# Patient Record
Sex: Male | Born: 1985
Health system: Southern US, Community
[De-identification: ages and names within clinical notes are randomized; demographics above are authoritative.]

## PROBLEM LIST (undated history)

## (undated) ENCOUNTER — Emergency Department (HOSPITAL_COMMUNITY): Admission: EM | Payer: 59 | Source: Home / Self Care

## (undated) DIAGNOSIS — R001 Bradycardia, unspecified: Secondary | ICD-10-CM

## (undated) DIAGNOSIS — Z8489 Family history of other specified conditions: Secondary | ICD-10-CM

## (undated) HISTORY — PX: OTHER SURGICAL HISTORY: SHX169

## (undated) HISTORY — PX: ANKLE SURGERY: SHX546

---
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
## 2011-05-11 ENCOUNTER — Ambulatory Visit: Payer: Self-pay

## 2012-03-02 ENCOUNTER — Ambulatory Visit
Admission: RE | Admit: 2012-03-02 | Discharge: 2012-03-02 | Disposition: A | Discharge status: Home / Self Care | Payer: 59 | Source: Ambulatory Visit | Attending: Family Medicine | Admitting: Family Medicine

## 2012-03-02 ENCOUNTER — Other Ambulatory Visit: Payer: Self-pay | Admitting: Family Medicine

## 2012-03-02 DIAGNOSIS — R079 Chest pain, unspecified: Secondary | ICD-10-CM

## 2014-11-15 ENCOUNTER — Ambulatory Visit (INDEPENDENT_AMBULATORY_CARE_PROVIDER_SITE_OTHER): Payer: 59 | Admitting: Emergency Medicine

## 2014-11-15 VITALS — BP 122/86 | HR 55 | Temp 98.0°F | Resp 16 | Ht 74.0 in | Wt 156.0 lb

## 2014-11-15 DIAGNOSIS — J209 Acute bronchitis, unspecified: Secondary | ICD-10-CM | POA: Diagnosis not present

## 2014-11-15 DIAGNOSIS — J0140 Acute pansinusitis, unspecified: Secondary | ICD-10-CM | POA: Diagnosis not present

## 2014-11-15 MED ORDER — PSEUDOEPHEDRINE-GUAIFENESIN ER 60-600 MG PO TB12: 1.0000 | ORAL_TABLET | Freq: Two times a day (BID) | ORAL | Status: AC

## 2014-11-15 MED ORDER — AMOXICILLIN-POT CLAVULANATE 875-125 MG PO TABS: 1.0000 | ORAL_TABLET | Freq: Two times a day (BID) | ORAL | Status: DC

## 2014-11-15 MED ORDER — HYDROCOD POLST-CPM POLST ER 10-8 MG/5ML PO SUER: 5.0000 mL | Freq: Two times a day (BID) | ORAL | Status: DC

## 2014-11-15 NOTE — Progress Notes (Signed)
Subjective:  Patient ID: Robert Charles, male    DOB: [birth year]  Age: 29 y.o. MRN: [id number]  CC: Cough; Fever; and Sore Throat   HPI Robert Charles presents   With a fever to 101. He has nasal congestion postnasal drainage. That's up purulent collar. He has a cough productive of purulent sputum. He has no wheezing or shortness breath. No nausea vomiting. No rash. No stool change. He's had no improvement with over-the-counter medication. 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   He has past surgical history that includes Ankle surgery (Right, reconstruction).   His  family history includes Heart disease in his father.  He   reports that he has never smoked. He does not have any smokeless tobacco history on file. He reports that he drinks about 6.0 oz of alcohol per week. He reports that he does not use illicit drugs.  No outpatient prescriptions prior to visit.   No facility-administered medications prior to visit.    Social History   Social History  . Marital Status: Single    Spouse Name: N/A  . Number of Children: N/A  . Years of Education: N/A   Social History Main Topics  . Smoking status: Never Smoker   . Smokeless tobacco: None  . Alcohol Use: 6.0 oz/week    10 Standard drinks or equivalent per week  . Drug Use: No  . Sexual Activity: Not Asked   Other Topics Concern  . None   Social History Narrative  . None     Review of Systems  Constitutional: Negative for fever, chills and appetite change.  HENT: Negative for congestion, ear pain, postnasal drip, sinus pressure and sore throat.   Eyes: Negative for pain and redness.  Respiratory: Negative for cough, shortness of breath and wheezing.   Cardiovascular: Negative for leg swelling.  Gastrointestinal: Negative for nausea, vomiting, abdominal pain, diarrhea, constipation and blood in stool.  Endocrine: Negative for polyuria.  Genitourinary:  Negative for dysuria, urgency, frequency and flank pain.  Musculoskeletal: Negative for gait problem.  Skin: Negative for rash.  Neurological: Negative for weakness and headaches.  Psychiatric/Behavioral: Negative for confusion and decreased concentration. The patient is not nervous/anxious.     Objective:  BP 122/86 mmHg  Pulse 55  Temp(Src) 98 F (36.7 C) (Oral)  Resp 16  Ht  (1.88 m)  Wt 156 lb (70.761 kg)  BMI 20.02 kg/m2  SpO2 99%  Physical Exam  Constitutional: He is oriented to person, place, and time. He appears well-developed and well-nourished. No distress.  HENT:  Head: Normocephalic and atraumatic.  Right Ear: External ear normal.  Left Ear: External ear normal.  Nose: Nose normal.  Eyes: Conjunctivae and EOM are normal. Pupils are equal, round, and reactive to light. No scleral icterus.  Neck: Normal range of motion. Neck supple. No tracheal deviation present.  Cardiovascular: Normal rate, regular rhythm and normal heart sounds.   Pulmonary/Chest: Effort normal. No respiratory distress. He has no wheezes. He has no rales.  Abdominal: He exhibits no mass. There is no tenderness. There is no rebound and no guarding.  Musculoskeletal: He exhibits no edema.  Lymphadenopathy:    He has no cervical adenopathy.  Neurological: He is alert and oriented to person, place, and time. Coordination normal.  Skin: Skin is warm and dry. No rash noted.  Psychiatric: He has a normal mood and affect. His behavior is normal.  Assessment & Plan:   Damarien was seen today for cough, fever and sore throat.  Diagnoses and all orders for this visit:  Acute pansinusitis, recurrence not specified  Acute bronchitis, unspecified organism  Other orders -     amoxicillin-clavulanate (AUGMENTIN) 875-125 MG per tablet; Take 1 tablet by mouth 2 (two) times daily. -     pseudoephedrine-guaifenesin (MUCINEX D) 60-600 MG per tablet; Take 1 tablet by mouth every 12 (twelve) hours. -      chlorpheniramine-HYDROcodone (TUSSIONEX PENNKINETIC ER) 10-8 MG/5ML SUER; Take 5 mLs by mouth 2 (two) times daily.  I am having Robert Charles start on amoxicillin-clavulanate, pseudoephedrine-guaifenesin, and chlorpheniramine-HYDROcodone.  Meds ordered this encounter  Medications  . amoxicillin-clavulanate (AUGMENTIN) 875-125 MG per tablet    Sig: Take 1 tablet by mouth 2 (two) times daily.    Dispense:  20 tablet    Refill:  0  . pseudoephedrine-guaifenesin (MUCINEX D) 60-600 MG per tablet    Sig: Take 1 tablet by mouth every 12 (twelve) hours.    Dispense:  18 tablet    Refill:  0  . chlorpheniramine-HYDROcodone (TUSSIONEX PENNKINETIC ER) 10-8 MG/5ML SUER    Sig: Take 5 mLs by mouth 2 (two) times daily.    Dispense:  60 mL    Refill:  0    Appropriate red flag conditions were discussed with the patient as well as actions that should be taken.  Patient expressed his understanding.  Follow-up: Return if symptoms worsen or fail to improve.  Carmelina Dane, MD

## 2014-11-15 NOTE — Patient Instructions (Signed)

## 2016-01-10 ENCOUNTER — Telehealth: Payer: Self-pay | Admitting: Interventional Cardiology

## 2016-01-10 NOTE — Telephone Encounter (Signed)
    Please request the record of his echo done at [facility], likely in early 2014.  Thanks.  If the patient needs to be reached, call [phone].  JV

## 2016-01-11 NOTE — Telephone Encounter (Addendum)
**Note De-Identified Robert Charles Obfuscation** I have requested the pts Echo results from 2014 from [facility].

## 2016-09-20 ENCOUNTER — Emergency Department (HOSPITAL_BASED_OUTPATIENT_CLINIC_OR_DEPARTMENT_OTHER)
Admission: EM | Admit: 2016-09-20 | Discharge: 2016-09-21 | Disposition: A | Discharge status: Home / Self Care | Payer: 59 | Attending: Physician Assistant | Admitting: Physician Assistant

## 2016-09-20 ENCOUNTER — Encounter (HOSPITAL_BASED_OUTPATIENT_CLINIC_OR_DEPARTMENT_OTHER): Payer: Self-pay | Admitting: Respiratory Therapy

## 2016-09-20 DIAGNOSIS — Y929 Unspecified place or not applicable: Secondary | ICD-10-CM | POA: Insufficient documentation

## 2016-09-20 DIAGNOSIS — S0185XA Open bite of other part of head, initial encounter: Secondary | ICD-10-CM

## 2016-09-20 DIAGNOSIS — S0993XA Unspecified injury of face, initial encounter: Secondary | ICD-10-CM | POA: Diagnosis present

## 2016-09-20 DIAGNOSIS — Y999 Unspecified external cause status: Secondary | ICD-10-CM | POA: Diagnosis not present

## 2016-09-20 DIAGNOSIS — S01511A Laceration without foreign body of lip, initial encounter: Secondary | ICD-10-CM | POA: Diagnosis not present

## 2016-09-20 DIAGNOSIS — W540XXA Bitten by dog, initial encounter: Secondary | ICD-10-CM | POA: Insufficient documentation

## 2016-09-20 DIAGNOSIS — Y939 Activity, unspecified: Secondary | ICD-10-CM | POA: Diagnosis not present

## 2016-09-20 MED ORDER — LIDOCAINE HCL (PF) 1 % IJ SOLN
30.0000 mL | Freq: Once | INTRAMUSCULAR | Status: AC
  Administered 2016-09-21: 30 mL via INTRADERMAL
  Filled 2016-09-20: qty 30

## 2016-09-20 NOTE — ED Notes (Signed)
ED Provider at bedside. 

## 2016-09-20 NOTE — ED Triage Notes (Signed)
PT presents with c/o dog bite to right side of face.

## 2016-09-20 NOTE — ED Provider Notes (Signed)
[facility]   CSN: [id number] Arrival date & time: 09/20/16  2217     History   Chief Complaint Chief Complaint  Patient presents with  . Animal Bite    HPI Robert Charles is a 31 y.o. male.  HPI   31 year old male presenting with dog bite face. Patient's golden retriever mixed was playing with him and bit him in the lip. Dog is up-to-date on vaccines. Patient is up-to-date on tetanus.  History reviewed. No pertinent past medical history.  There are no active problems to display for this patient.   Past Surgical History:  Procedure Laterality Date  . ANKLE SURGERY Right reconstruction   2005       Home Medications    Prior to Admission medications   Medication Sig Start Date End Date Taking? Authorizing Provider  amoxicillin-clavulanate (AUGMENTIN) 875-125 MG per tablet Take 1 tablet by mouth 2 (two) times daily. 11/15/14   Carmelina DaneAnderson, Jeffery S, MD  chlorpheniramine-HYDROcodone [facility](TUSSIONEX PENNKINETIC ER) 10-8 MG/5ML SUER Take 5 mLs by mouth 2 (two) times daily. 11/15/14   Carmelina DaneAnderson, Jeffery S, MD    Family History Family History  Problem Relation Age of Onset  . Heart disease Father     Social History Social History  Substance Use Topics  . Smoking status: Never Smoker  . Smokeless tobacco: Not on file  . Alcohol use 6.0 oz/week    10 Standard drinks or equivalent per week     Allergies   Patient has no known allergies.   Review of Systems Review of Systems  Constitutional: Negative for activity change.  Respiratory: Negative for shortness of breath.   Cardiovascular: Negative for chest pain.  Gastrointestinal: Negative for abdominal pain.     Physical Exam Updated Vital Signs BP (!) 139/93 (BP Location: Left Arm)   Pulse 78   Temp 98.1 F (36.7 C) (Oral)   Resp 18   SpO2 99%   Physical Exam  Constitutional: He is oriented to person, place, and time. He appears well-nourished.  HENT:  Head: Normocephalic.  3  cm laceration crossing the vermilion border on the right-hand side. Patient has mustache.  As also left shoulder laceration on the interior of the mouth but is not through and through.  Eyes: Conjunctivae are normal.  Cardiovascular: Normal rate and regular rhythm.   Pulmonary/Chest: Effort normal and breath sounds normal. No respiratory distress.  Neurological: He is oriented to person, place, and time.  Skin: Skin is warm and dry. He is not diaphoretic.  Psychiatric: He has a normal mood and affect. His behavior is normal.     ED Treatments / Results  Labs (all labs ordered are listed, but only abnormal results are displayed) Labs Reviewed - No data to display  EKG  EKG Interpretation None       Radiology No results found.  Procedures Procedures (including critical care time)  Medications Ordered in ED Medications  lidocaine (PF) (XYLOCAINE) 1 % injection 30 mL (not administered)     Initial Impression / Assessment and Plan / ED Course  I have reviewed the triage vital signs and the nursing notes.  Pertinent labs & imaging results that were available during my care of the patient were reviewed by me and considered in my medical decision making (see chart for details).     Patient is here with dog bite to upper lip. Will repair given that it just happened, it is to the face and cosmetically needs repair. Augment  started.  LACERATION REPAIR Performed by: Arlana Hoveourteney L Amarianna Abplanalp Authorized by: Arlana Hoveourteney L Tamas Suen Consent: Verbal consent obtained. Risks and benefits: risks, benefits and alternatives were discussed Consent given by: patient Patient identity confirmed: provided demographic data Prepped and Draped in normal sterile fashion Wound explored  Laceration Location: upper lip Laceration Length: 3 cm  No Foreign Bodies seen or palpated  Anesthesia: local infiltration  Local anesthetic: lidocaine 1 w epinephrine  Anesthetic total: 7 ml  Irrigation  method: syringe Amount of cleaning: standard  Skin closure: 4-0 vicryl deep, and subcutaneously - 6 suttures 5-0 chromic gut for superficial - X4   Patient tolerance: Patient tolerated the procedure well with no immediate complications.   Final Clinical Impressions(s) / ED Diagnoses   Final diagnoses:  None    New Prescriptions New Prescriptions   No medications on file     Abelino DerrickMackuen, Giordan Fordham Lyn, MD 09/21/16 [phone]

## 2016-09-20 NOTE — ED Notes (Signed)
Right upper lip laceration measuring 2.0 cm

## 2016-09-21 DIAGNOSIS — S01511A Laceration without foreign body of lip, initial encounter: Secondary | ICD-10-CM | POA: Diagnosis not present

## 2016-09-21 MED ORDER — AMOXICILLIN-POT CLAVULANATE 875-125 MG PO TABS: 1.0000 | ORAL_TABLET | Freq: Two times a day (BID) | ORAL | 0 refills | Status: DC

## 2016-09-21 MED ORDER — IBUPROFEN 800 MG PO TABS
800.0000 mg | ORAL_TABLET | Freq: Once | ORAL | Status: AC
  Administered 2016-09-21: 800 mg via ORAL
  Filled 2016-09-21: qty 1

## 2016-09-21 MED ORDER — AMOXICILLIN-POT CLAVULANATE 875-125 MG PO TABS
1.0000 | ORAL_TABLET | Freq: Once | ORAL | Status: AC
  Administered 2016-09-21: 1 via ORAL
  Filled 2016-09-21: qty 1

## 2016-09-21 NOTE — ED Notes (Signed)
Sutures intact. No bleeding noted at d/c home. Voiced understanding of suture care, f/u, and antibiotics

## 2016-09-21 NOTE — Discharge Instructions (Signed)
Please take antibiotics as prescribed. As we discussed use Vaseline or Neosporin over the wound. The stitches should be in place for 5 days.  If signs of infection please return immediately to the emergency department or your primary care.

## 2017-06-08 DIAGNOSIS — K645 Perianal venous thrombosis: Secondary | ICD-10-CM | POA: Diagnosis not present

## 2017-06-30 DIAGNOSIS — Z0000 Encounter for general adult medical examination without abnormal findings: Secondary | ICD-10-CM | POA: Diagnosis not present

## 2018-05-05 ENCOUNTER — Ambulatory Visit
Admission: EM | Admit: 2018-05-05 | Discharge: 2018-05-05 | Disposition: A | Discharge status: Home / Self Care | Payer: 59

## 2018-05-05 ENCOUNTER — Other Ambulatory Visit: Payer: Self-pay

## 2018-05-05 DIAGNOSIS — S0990XA Unspecified injury of head, initial encounter: Secondary | ICD-10-CM | POA: Diagnosis not present

## 2018-05-05 DIAGNOSIS — H538 Other visual disturbances: Secondary | ICD-10-CM | POA: Diagnosis not present

## 2018-05-05 NOTE — ED Provider Notes (Signed)
[facility]    CSN: [id number] Arrival date & time: 05/05/18  1821     History   Chief Complaint Chief Complaint  Patient presents with  . Blurred Vision    HPI Robert Charles is a 33 y.o. male.   Robert Charles presents with complaints of episode today at work in which he developed blurring to his peripheral vision. He works in the [employer] at [employer]. 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 It didn't matter if he covered one of his eyes or the other, vision was the same. States he that was walking away from his work place and he felt some nausea and then mild dizziness like vertigo. He did not have head pain at the time of these symptoms. He was directed to the ER where he sat waiting, and symptoms resolved. Now his vision is normal, no blurring. No further dizziness or nausea. Now with mild headache, approximately 2/10. States overall his symptoms lasted approximately 45 minutes. He feels well now. He was concerned because two weeks ago he struck his head on a piece of equipment. He did not lose consciousness, he "saw stars."  Has not had a headache since this incident. States he had migraines as a child but hasn't had any migraines in probably 20 years. He was not evaluated after he struck his head. No previous head injury. He doesn't wear contacts or glasses. Doesn't take any prescribed medications. Patient states that he feels much better, doesn't want to go to the ER tonight, and his wife was quite insistent that he was seen.    ROS per HPI, negative if not otherwise mentioned.      History reviewed. No pertinent past medical history.  There are no active problems to display for this patient.   Past Surgical History:  Procedure Laterality Date  . ANKLE SURGERY Right reconstruction   2005       Home Medications    Prior to Admission medications   Medication Sig Start Date End Date Taking?  Authorizing Provider  amoxicillin-clavulanate (AUGMENTIN) 875-125 MG per tablet Take 1 tablet by mouth 2 (two) times daily. 11/15/14   Carmelina Dane, MD  amoxicillin-clavulanate (AUGMENTIN) 875-125 MG tablet Take 1 tablet by mouth every 12 (twelve) hours. 09/21/16   Mackuen, Courteney Lyn, MD  chlorpheniramine-HYDROcodone (TUSSIONEX PENNKINETIC ER) 10-8 MG/5ML SUER Take 5 mLs by mouth 2 (two) times daily. 11/15/14   Carmelina Dane, MD    Family History Family History  Problem Relation Age of Onset  . Heart disease Father     Social History Social History   Tobacco Use  . Smoking status: Never Smoker  . Smokeless tobacco: Never Used  Substance Use Topics  . Alcohol use: Yes    Alcohol/week: 10.0 standard drinks    Types: 10 Standard drinks or equivalent per week  . Drug use: No     Allergies   Patient has no known allergies.   Review of Systems Review of Systems   Physical Exam Triage Vital Signs ED Triage Vitals [05/05/18 1832]  Enc Vitals Group     BP (!) 144/91     Pulse Rate 70     Resp 18     Temp 97.9 F (36.6 C)     Temp Source Oral     SpO2 99 %     Weight      Height  Head Circumference      Peak Flow      Pain Score 2     Pain Loc      Pain Edu?      Excl. in GC?    No data found.  Updated Vital Signs BP (!) 144/91 (BP Location: Left Arm)   Pulse 70   Temp 97.9 F (36.6 C) (Oral)   Resp 18   SpO2 99%   Visual Acuity Right Eye Distance: 20/20 Left Eye Distance: 20/20 Bilateral Distance: 20/20  Right Eye Near:   Left Eye Near:    Bilateral Near:     Physical Exam Constitutional:      Appearance: He is well-developed.  HENT:     Head: Normocephalic and atraumatic.  Eyes:     General: Lids are normal. Vision grossly intact. Gaze aligned appropriately.        Right eye: No discharge.        Left eye: No discharge.     Extraocular Movements: Extraocular movements intact.     Conjunctiva/sclera: Conjunctivae normal.   Neck:     Musculoskeletal: Normal range of motion.  Cardiovascular:     Rate and Rhythm: Normal rate and regular rhythm.  Pulmonary:     Effort: Pulmonary effort is normal.     Breath sounds: Normal breath sounds.  Skin:    General: Skin is warm and dry.  Neurological:     General: No focal deficit present.     Mental Status: He is alert and oriented to person, place, and time. Mental status is at baseline.     Cranial Nerves: Cranial nerves are intact.     Sensory: Sensation is intact.     Motor: Motor function is intact.     Coordination: Coordination is intact.     Gait: Gait is intact.      UC Treatments / Results  Labs (all labs ordered are listed, but only abnormal results are displayed) Labs Reviewed - No data to display  EKG None  Radiology No results found.  Procedures Procedures (including critical care time)  Medications Ordered in UC Medications - No data to display  Initial Impression / Assessment and Plan / UC Course  I have reviewed the triage vital signs and the nursing notes.  Pertinent labs & imaging results that were available during my care of the patient were reviewed by me and considered in my medical decision making (see chart for details).     No acute findings on neurological or eye exam. Head injury without loss of consciousness two weeks ago. States today was first day of heavy computer use. Distant history of migraines. No vision change or residual symptoms currently. Discussed plan of care at length with patient. Discussed that for complete and definitive evaluation patient would need to go to the ER, likely for imaging. Patient does not wish to go as symptoms have resolved. Agreeable to headache treatment with very close monitoring and strict return precautions to the er. Patient well educated and with wife, aware of plan and signs to return. Patient verbalized understanding and agreeable to plan.  Ambulatory out of clinic without  difficulty.   Final Clinical Impressions(s) / UC Diagnoses   Final diagnoses:  Blurred vision, bilateral  Injury of head, initial encounter     Discharge Instructions     It is reassuring that your vision is back to normal and symptoms have resolved.  With a recent head injury this can be concerning for  possible intracranial etiology.  Further evaluation and imaging is warranted for any return or increase in symptoms to include nausea, dizziness, headache, blurred or altered vision.  Ibuprofen, rest, benadryl for headache may be helpful tonight, but if any return or worsening of symptoms please go to the ER.  May follow up with ophthalmology as needed.    ED Prescriptions    None     Controlled Substance Prescriptions [place] Controlled Substance Registry consulted? Not Applicable   Georgetta Haber, NP 05/05/18 2147

## 2018-05-05 NOTE — ED Triage Notes (Signed)
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 States better now, just having a headache now.

## 2018-05-05 NOTE — Discharge Instructions (Signed)
It is reassuring that your vision is back to normal and symptoms have resolved.  With a recent head injury this can be concerning for possible intracranial etiology.  Further evaluation and imaging is warranted for any return or increase in symptoms to include nausea, dizziness, headache, blurred or altered vision.  Ibuprofen, rest, benadryl for headache may be helpful tonight, but if any return or worsening of symptoms please go to the ER.  May follow up with ophthalmology as needed.

## 2018-06-12 ENCOUNTER — Telehealth: Payer: 59 | Admitting: Physician Assistant

## 2018-06-12 DIAGNOSIS — R0602 Shortness of breath: Secondary | ICD-10-CM

## 2018-06-12 DIAGNOSIS — J069 Acute upper respiratory infection, unspecified: Secondary | ICD-10-CM

## 2018-06-12 MED ORDER — BENZONATATE 100 MG PO CAPS: 100.0000 mg | ORAL_CAPSULE | Freq: Three times a day (TID) | ORAL | 0 refills | Status: DC | PRN

## 2018-06-12 NOTE — Progress Notes (Signed)
E-Visit for Corona Virus Screening  Based on your current symptoms, you may very well have the virus, however your symptoms are mild. Currently, not all patients are being tested. If the symptoms are mild and there is not a known exposure, performing the test is not indicated.   Giving there is cough present along with the windedness and your risk factors, we will need to consider that there is a possibility symptoms are COVID-related. Most likely if you are not feeling winded constantly that the pain is musculoskeletal in origin, especially as it is alleviated with heating pad. Keep up with this. I will send in a prescription cough medication for you to have someone pick up for you.  You will need to contact [facility] at Work for potential testing and for instructions on return to work.   Coronavirus disease 2019 (COVID-19) is a respiratory illness that can spread from person to person. The virus that causes COVID-19 is a new virus that was first identified in the country of [place] but is now found in multiple other countries and has spread to the [place].  Symptoms associated with the virus are mild to severe fever, cough, and shortness of breath. There is currently no vaccine to protect against COVID-19, and there is no specific antiviral treatment for the virus.   To be considered HIGH RISK for Coronavirus (COVID-19), you have to meet the following criteria:  . Traveled to [place], [place], [place], [place] or [place]; or in the [place] to [place], [place], [place], or [place]; and have fever, cough, and shortness of breath within the last 2 weeks of travel OR  . Been in close contact with a person diagnosed with COVID-19 within the last 2 weeks and have fever, cough, and shortness of breath  . IF YOU DO NOT MEET THESE CRITERIA, YOU ARE CONSIDERED LOW RISK FOR COVID-19.   It is vitally important that if you feel that you have an infection such as this virus or any other virus that  you stay home and away from places where you may spread it to others.  You should self-quarantine for 14 days if you have symptoms that could potentially be coronavirus and avoid contact with people age 33 and older.   You can use medication such as A prescription cough medication called Tessalon Perles 100 mg. You may take 1-2 capsules every 8 hours as needed for cough (sent to pharmacy)  You may also take acetaminophen (Tylenol) as needed for fever.   Reduce your risk of any infection by using the same precautions used for avoiding the common cold or flu:  Marland Kitchen. Wash your hands often with soap and warm water for at least 20 seconds.  If soap and water are not readily available, use an alcohol-based hand sanitizer with at least 60% alcohol.  . If coughing or sneezing, cover your mouth and nose by coughing or sneezing into the elbow areas of your shirt or coat, into a tissue or into your sleeve (not your hands). . Avoid shaking hands with others and consider head nods or verbal greetings only. . Avoid touching your eyes, nose, or mouth with unwashed hands.  . Avoid close contact with people who are sick. . Avoid places or events with large numbers of people in one location, like concerts or sporting events. . Carefully consider travel plans you have or are making. . If you are planning any travel outside or inside the [place], visit the CDC's Travelers'  Health webpage for the latest health notices. . If you have some symptoms but not all symptoms, continue to monitor at home and seek medical attention if your symptoms worsen. . If you are having a medical emergency, call 911.  HOME CARE . Only take medications as instructed by your medical team. . Drink plenty of fluids and get plenty of rest. . A steam or ultrasonic humidifier can help if you have congestion.   GET HELP RIGHT AWAY IF: . You develop worsening fever. . You become short of breath . You cough up blood. . Your symptoms become more  severe MAKE SURE YOU   Understand these instructions.  Will watch your condition.  Will get help right away if you are not doing well or get worse.  Your e-visit answers were reviewed by a board certified advanced clinical practitioner to complete your personal care plan.  Depending on the condition, your plan could have included both over the counter or prescription medications.  If there is a problem please reply once you have received a response from your provider. Your safety is important to [place].  If you have drug allergies check your prescription carefully.    You can use MyChart to ask questions about today's visit, request a non-urgent call back, or ask for a work or school excuse for 24 hours related to this e-Visit. If it has been greater than 24 hours you will need to follow up with your provider, or enter a new e-Visit to address those concerns. You will get an e-mail in the next two days asking about your experience.  I hope that your e-visit has been valuable and will speed your recovery. Thank you for using e-visits.

## 2018-06-12 NOTE — Progress Notes (Signed)
I have spent 5 minutes in review of e-visit questionnaire, review and updating patient chart, medical decision making and response to patient.   Syreeta Figler Cody Zauria Dombek, PA-C    

## 2018-10-20 ENCOUNTER — Encounter: Payer: Self-pay | Admitting: Family Medicine

## 2018-10-20 ENCOUNTER — Ambulatory Visit (INDEPENDENT_AMBULATORY_CARE_PROVIDER_SITE_OTHER): Payer: 59 | Admitting: Family Medicine

## 2018-10-20 ENCOUNTER — Other Ambulatory Visit: Payer: Self-pay

## 2018-10-20 ENCOUNTER — Ambulatory Visit (INDEPENDENT_AMBULATORY_CARE_PROVIDER_SITE_OTHER)
Admission: RE | Admit: 2018-10-20 | Discharge: 2018-10-20 | Disposition: A | Discharge status: Home / Self Care | Payer: 59 | Source: Ambulatory Visit | Attending: Family Medicine | Admitting: Family Medicine

## 2018-10-20 VITALS — BP 120/84 | HR 55 | Temp 98.5°F | Ht 73.5 in | Wt 174.5 lb

## 2018-10-20 DIAGNOSIS — M4712 Other spondylosis with myelopathy, cervical region: Secondary | ICD-10-CM | POA: Diagnosis not present

## 2018-10-20 DIAGNOSIS — M4802 Spinal stenosis, cervical region: Secondary | ICD-10-CM | POA: Diagnosis not present

## 2018-10-20 DIAGNOSIS — M5412 Radiculopathy, cervical region: Secondary | ICD-10-CM | POA: Diagnosis not present

## 2018-10-20 MED ORDER — PREDNISONE 20 MG PO TABS: ORAL_TABLET | ORAL | 0 refills | Status: DC

## 2018-10-20 MED ORDER — CYCLOBENZAPRINE HCL 10 MG PO TABS: 10.0000 mg | ORAL_TABLET | Freq: Every evening | ORAL | 0 refills | Status: DC | PRN

## 2018-10-20 MED FILL — CYCLOBENZAPRINE 10 MG TAB: 10 | 30 days supply | Qty: 30 | Fill #0

## 2018-10-20 MED FILL — predniSONE 20 MG TABS: 20 | 14 days supply | Qty: 21 | Fill #0

## 2018-10-20 NOTE — Progress Notes (Signed)
Robert Colon T. Graclyn Lawther, MD Primary Care and Sports Medicine [facility] at [facility] [address] [place], [address] Phone: [phone]   FAX: [phone]  Robert Charles - 33 y.o. male   MRN [id number]   Date of Birth: 05/24/1985  Visit Date: 10/20/2018   PCP: Robert Albee, MD   Referred by: Robert Albee, MD  Chief Complaint  Patient presents with   Shoulder Pain    Left   Subjective:   Robert Charles is a 33 y.o. very pleasant male patient with Body mass index is 22.71 kg/m. who presents with the following:  New patient in our office who presents with L shoulder pain: 1 week and unbearably bad since Sunday.   About a week ago and had a crick in his neck and sore all thoughout the week and started to get more and painful.   Movement in the arm and all the way down.  Constanly   Numbness, tinglging and all the way around his arm and l thorax. It has really gotten bad in the last 3-4 days. No prior history of neck injury or radiculopathy. Tingling and burning pain.  Motrin, tylenol, hot showers, ice pack this morning. Has helped a lot.   Ulnar nerve distribution 4 and 5 on the L dec  No significant shoulder injury history.  Plays golf and baseball.  3-4 times a week.  Sat AM felt OK, but irritated and Sunday morning and played a lot of baseball.   Past Medical History, Surgical History, Social History, Family History, Problem List, Medications, and Allergies have been reviewed and updated if relevant.  There are no active problems to display for this patient.   History reviewed. No pertinent past medical history.  Past Surgical History:  Procedure Laterality Date   ANKLE SURGERY Right reconstruction   2005    Social History   Socioeconomic History   Marital status: Married    Spouse name: Not on file   Number of children: Not on file   Years of education: Not on file   Highest education level: Not on file  Occupational  History   Not on file  Social Needs   Financial resource strain: Not on file   Food insecurity    Worry: Not on file    Inability: Not on file   Transportation needs    Medical: Not on file    Non-medical: Not on file  Tobacco Use   Smoking status: Never Smoker   Smokeless tobacco: Never Used  Substance and Sexual Activity   Alcohol use: Yes    Alcohol/week: 10.0 standard drinks    Types: 10 Standard drinks or equivalent per week   Drug use: No   Sexual activity: Not on file  Lifestyle   Physical activity    Days per week: Not on file    Minutes per session: Not on file   Stress: Not on file  Relationships   Social connections    Talks on phone: Not on file    Gets together: Not on file    Attends religious service: Not on file    Active member of club or organization: Not on file    Attends meetings of clubs or organizations: Not on file    Relationship status: Not on file   Intimate partner violence    Fear of current or ex partner: Not on file    Emotionally abused: Not on file  Physically abused: Not on file    Forced sexual activity: Not on file  Other Topics Concern   Not on file  Social History Narrative   Not on file    Family History  Problem Relation Age of Onset   Heart disease Father     No Known Allergies  Medication list reviewed and updated in full in [facility].  GEN: No fevers, chills. Nontoxic. Primarily MSK c/o today. MSK: Detailed in the HPI GI: tolerating PO intake without difficulty Neuro: as above Otherwise the pertinent positives of the ROS are noted above.   Objective:   BP 120/84    Pulse (!) 55    Temp 98.5 F (36.9 C) (Temporal)    Ht 6' 1.5" (1.867 m)    Wt 174 lb 8 oz (79.2 kg)    SpO2 95%    BMI 22.71 kg/m    GEN: Well-developed,well-nourished,in no acute distress; alert,appropriate and cooperative throughout examination HEENT: Normocephalic and atraumatic without obvious abnormalities. Ears,  externally no deformities PULM: Breathing comfortably in no respiratory distress EXT: No clubbing, cyanosis, or edema PSYCH: Normally interactive. Cooperative during the interview. Pleasant. Friendly and conversant. Not anxious or depressed appearing. Normal, full affect.  CERVICAL SPINE EXAM Range of motion: Flexion, extension, lateral bending, and rotation: 20% global loss of motion Pain with terminal motion: y Spinous Processes: NT SCM: NT Upper paracervical muscles: ttp Upper traps: NT C5-T1 intact, sensation and motor with slight decrease pinprick 4th and 5th on the L  Shoulder: L Inspection: No muscle wasting or winging Ecchymosis/edema: neg  AC joint, scapula, clavicle: NT Abduction: full, 5/5 Flexion: full, 5/5 IR, full, lift-off: 5/5 ER at neutral: full, 5/5 AC crossover and compression: neg Neer: neg Hawkins: neg Drop Test: neg Empty Can: neg Supraspinatus insertion: NT Bicipital groove: NT Speed's: neg Yergason's: neg Sulcus sign: neg Scapular dyskinesis: none C5-T1 intact Sensation intact Grip 5/5   Radiology: Dg Cervical Spine Complete  Result Date: 10/20/2018 CLINICAL DATA:  Acute left-sided cervical radiculopathy EXAM: CERVICAL SPINE - COMPLETE 4+ VIEW COMPARISON:  None. FINDINGS: All 7 cervical vertebrae are well visualized. No acute fracture or traumatic listhesis is evident. The dens is intact. Lateral masses of C1 are normally apposed to C2. Intervertebral disc spaces are largely maintained. Mild uncinate spurring is present C5-6 and C6-7 which results and mild left neural foraminal narrowing at these levels. No other significant canal stenosis or foraminal narrowing. No acute abnormality in the upper chest or imaged lung apices. IMPRESSION: Mild degenerative changes at C5-6 and C6-7 resulting mild left neural foraminal narrowing at these levels. Electronically Signed   By: Kreg ShropshirePrice  DeHay M.D.   On: 10/20/2018 16:11     Assessment and Plan:     ICD-10-CM    1. Left cervical radiculopathy  M54.12 DG Cervical Spine Complete   L cervical rad, likely disc  F/u 3-4 weeks  Follow-up: No follow-ups on file.  Meds ordered this encounter  Medications   predniSONE (DELTASONE) 20 MG tablet    Sig: 2 tabs po for 7 days, then 1 tab po for 7 days    Dispense:  21 tablet    Refill:  0   cyclobenzaprine (FLEXERIL) 10 MG tablet    Sig: Take 1 tablet (10 mg total) by mouth at bedtime as needed for muscle spasms.    Dispense:  30 tablet    Refill:  0   Orders Placed This Encounter  Procedures   DG Cervical Spine Complete  Signed,  Maud Deed. Juwana Thoreson, MD   Outpatient Encounter Medications as of 10/20/2018  Medication Sig   cyclobenzaprine (FLEXERIL) 10 MG tablet Take 1 tablet (10 mg total) by mouth at bedtime as needed for muscle spasms.   predniSONE (DELTASONE) 20 MG tablet 2 tabs po for 7 days, then 1 tab po for 7 days   [DISCONTINUED] amoxicillin-clavulanate (AUGMENTIN) 875-125 MG per tablet Take 1 tablet by mouth 2 (two) times daily.   [DISCONTINUED] amoxicillin-clavulanate (AUGMENTIN) 875-125 MG tablet Take 1 tablet by mouth every 12 (twelve) hours.   [DISCONTINUED] benzonatate (TESSALON) 100 MG capsule Take 1 capsule (100 mg total) by mouth 3 (three) times daily as needed for cough.   [DISCONTINUED] chlorpheniramine-HYDROcodone (TUSSIONEX PENNKINETIC ER) 10-8 MG/5ML SUER Take 5 mLs by mouth 2 (two) times daily.   No facility-administered encounter medications on file as of 10/20/2018.

## 2018-10-28 ENCOUNTER — Encounter: Payer: Self-pay | Admitting: Family Medicine

## 2018-10-28 MED ORDER — GABAPENTIN 100 MG PO CAPS: 100.0000 mg | ORAL_CAPSULE | Freq: Three times a day (TID) | ORAL | 3 refills | Status: DC

## 2018-10-28 MED FILL — GABAPENTIN 100 MG CAPSULE: 100 | 30 days supply | Qty: 90 | Fill #0

## 2018-11-05 DIAGNOSIS — M4722 Other spondylosis with radiculopathy, cervical region: Secondary | ICD-10-CM | POA: Diagnosis not present

## 2018-11-05 DIAGNOSIS — R29898 Other symptoms and signs involving the musculoskeletal system: Secondary | ICD-10-CM | POA: Diagnosis not present

## 2018-11-05 DIAGNOSIS — M5030 Other cervical disc degeneration, unspecified cervical region: Secondary | ICD-10-CM | POA: Diagnosis not present

## 2018-11-05 DIAGNOSIS — M5412 Radiculopathy, cervical region: Secondary | ICD-10-CM | POA: Diagnosis not present

## 2018-11-10 ENCOUNTER — Ambulatory Visit: Payer: 59 | Admitting: Family Medicine

## 2018-11-10 DIAGNOSIS — M5412 Radiculopathy, cervical region: Secondary | ICD-10-CM | POA: Diagnosis not present

## 2018-11-10 DIAGNOSIS — M542 Cervicalgia: Secondary | ICD-10-CM | POA: Diagnosis not present

## 2018-11-12 ENCOUNTER — Other Ambulatory Visit: Payer: Self-pay | Admitting: Neurosurgery

## 2018-11-12 DIAGNOSIS — M5020 Other cervical disc displacement, unspecified cervical region: Secondary | ICD-10-CM | POA: Diagnosis not present

## 2018-11-12 DIAGNOSIS — M5412 Radiculopathy, cervical region: Secondary | ICD-10-CM | POA: Diagnosis not present

## 2018-11-12 DIAGNOSIS — R030 Elevated blood-pressure reading, without diagnosis of hypertension: Secondary | ICD-10-CM | POA: Diagnosis not present

## 2018-11-12 DIAGNOSIS — M4722 Other spondylosis with radiculopathy, cervical region: Secondary | ICD-10-CM | POA: Diagnosis not present

## 2018-11-12 DIAGNOSIS — M5030 Other cervical disc degeneration, unspecified cervical region: Secondary | ICD-10-CM | POA: Diagnosis not present

## 2018-11-12 DIAGNOSIS — R29898 Other symptoms and signs involving the musculoskeletal system: Secondary | ICD-10-CM | POA: Diagnosis not present

## 2018-11-26 NOTE — Pre-Procedure Instructions (Signed)
[place], [place] - [address] [place] [address] Phone: [phone] Fax: [phone]    Your procedure is scheduled on Thurs., Oct. 8, 2020 from 7:30AM-9:31AM  Report to [facility] Entrance "A" at 5:30AM  Call this number if you have problems the morning of surgery:  [phone]   Remember:  Do not eat or drink after midnight on Oct. 7th    Take these medicines the morning of surgery with A SIP OF WATER: Gabapentin (NEURONTIN)   As of today, stop taking all Aspirin (unless instructed by your doctor) and Other Aspirin containing products, Vitamins, Fish oils, and Herbal medications. Also stop all NSAIDS i.e. Advil, Ibuprofen, Motrin, Aleve, Anaprox, Naproxen, BC, Goody Powders, and all Supplements.  No Tobacco or Alcohol products for 24 hours prior to your procedure.  Special instructions:   [place]- Preparing For Surgery  Before surgery, you can play an important role. Because skin is not sterile, your skin needs to be as free of germs as possible. You can reduce the number of germs on your skin by washing with CHG (chlorahexidine gluconate) Soap before surgery.  CHG is an antiseptic cleaner which kills germs and bonds with the skin to continue killing germs even after washing.    Please do not use if you have an allergy to CHG or antibacterial soaps. If your skin becomes reddened/irritated stop using the CHG.  Do not shave (including legs and underarms) for at least 48 hours prior to first CHG shower. It is OK to shave your face.  Please follow these instructions carefully.   1. Shower the NIGHT BEFORE SURGERY and the MORNING OF SURGERY with CHG.   2. If you chose to wash your hair, wash your hair first as usual with your normal shampoo.  3. After you shampoo, rinse your hair and body thoroughly to remove the shampoo.  4. Use CHG as you would any other liquid soap. You can apply CHG  directly to the skin and wash gently with a scrungie or a clean washcloth.   5. Apply the CHG Soap to your body ONLY FROM THE NECK DOWN.  Do not use on open wounds or open sores. Avoid contact with your eyes, ears, mouth and genitals (private parts). Wash Face and genitals (private parts)  with your normal soap.  6. Wash thoroughly, paying special attention to the area where your surgery will be performed.  7. Thoroughly rinse your body with warm water from the neck down.  8. DO NOT shower/wash with your normal soap after using and rinsing off the CHG Soap.  9. Pat yourself dry with a CLEAN TOWEL.  10. Wear CLEAN PAJAMAS to bed the night before surgery, wear comfortable clothes the morning of surgery  11. Place CLEAN SHEETS on your bed the night of your first shower and DO NOT SLEEP WITH PETS.   Day of Surgery: Remember to brush your teeth WITH YOUR REGULAR TOOTHPASTE.  Do not wear jewelry.  Do not wear lotions, powders, colognes, or deodorant.  Do not shave 48 hours prior to surgery.  Men may shave face and neck.  Do not bring valuables to the hospital.  [facility] is not responsible for any belongings or valuables.  Contacts, dentures or bridgework may not be worn into surgery.   For patients admitted to the hospital, discharge time will be determined by your treatment team.  Patients discharged the day of  surgery will not be allowed to drive home, and someone 18 years and older has to be with them for 24 hours.  Please wear clean clothes to the hospital/surgery center.    Please read over the following fact sheets that you were given. Pain Booklet, Coughing and Deep Breathing, MRSA Information and Surgical Site Infection Prevention

## 2018-11-29 ENCOUNTER — Encounter (HOSPITAL_COMMUNITY): Payer: Self-pay

## 2018-11-29 ENCOUNTER — Other Ambulatory Visit: Payer: Self-pay

## 2018-11-29 ENCOUNTER — Encounter (HOSPITAL_COMMUNITY)
Admission: RE | Admit: 2018-11-29 | Discharge: 2018-11-29 | Disposition: A | Discharge status: Home / Self Care | Payer: 59 | Source: Ambulatory Visit | Attending: Neurosurgery | Admitting: Neurosurgery

## 2018-11-29 DIAGNOSIS — Z01812 Encounter for preprocedural laboratory examination: Secondary | ICD-10-CM | POA: Diagnosis not present

## 2018-11-29 HISTORY — DX: Bradycardia, unspecified: R00.1

## 2018-11-29 HISTORY — DX: Family history of other specified conditions: Z84.89

## 2018-11-29 LAB — SURGICAL PCR SCREEN
MRSA, PCR: NEGATIVE
Staphylococcus aureus: NEGATIVE

## 2018-11-29 LAB — COMPREHENSIVE METABOLIC PANEL
ALT: 21 U/L (ref 0–44)
AST: 26 U/L (ref 15–41)
Albumin: 4.4 g/dL (ref 3.5–5.0)
Alkaline Phosphatase: 32 U/L — ABNORMAL LOW (ref 38–126)
Anion gap: 9 (ref 5–15)
BUN: 13 mg/dL (ref 6–20)
CO2: 27 mmol/L (ref 22–32)
Calcium: 9.7 mg/dL (ref 8.9–10.3)
Chloride: 105 mmol/L (ref 98–111)
Creatinine, Ser: 1.11 mg/dL (ref 0.61–1.24)
GFR calc Af Amer: >60 mL/min (ref >60)
GFR calc non Af Amer: >60 mL/min (ref >60)
Glucose, Bld: 103 mg/dL — ABNORMAL HIGH (ref 70–99)
Potassium: 3.9 mmol/L (ref 3.5–5.1)
Sodium: 141 mmol/L (ref 135–145)
Total Bilirubin: 0.9 mg/dL (ref 0.3–1.2)
Total Protein: 6.8 g/dL (ref 6.5–8.1)

## 2018-11-29 LAB — CBC
HCT: 45.3 % (ref 39.0–52.0)
Hemoglobin: 15 g/dL (ref 13.0–17.0)
MCH: 29.6 pg (ref 26.0–34.0)
MCHC: 33.1 g/dL (ref 30.0–36.0)
MCV: 89.5 fL (ref 80.0–100.0)
Platelets: 240 10*3/uL (ref 150–400)
RBC: 5.06 MIL/uL (ref 4.22–5.81)
RDW: 11.7 % (ref 11.5–15.5)
WBC: 4.4 10*3/uL (ref 4.0–10.5)
nRBC: 0 % (ref 0.0–0.2)

## 2018-11-29 NOTE — Progress Notes (Signed)
PCP - Lou Miner, MD Cardiologist - Denies  PPM/ICD - N/A Device Orders - N/A Rep Notified N/A  Chest x-ray - N/A EKG - N/A Stress Test - N/A ECHO - 04/07/12 (in Epic) Cardiac Cath - N/A  Sleep Study - N/A CPAP - N/A  Fasting Blood Sugar - N/A Checks Blood Sugar __N/A___ times a day  Blood Thinner Instructions: N/A Aspirin Instructions: N/A  ERAS Protcol - N/A PRE-SURGERY Ensure - N/A  COVID TEST- 11/30/2018   Anesthesia review: No   Patient denies shortness of breath, fever, cough and chest pain at PAT appointment   Coronavirus Screening  Have you experienced the following symptoms:  Cough yes/no: No Fever (>100.17F)  yes/no: No Runny nose yes/no: No Sore throat yes/no: No Difficulty breathing/shortness of breath  yes/no: No  Have you or a family member traveled in the last 14 days and where? yes/no: No   If the patient indicates "YES" to the above questions, their PAT will be rescheduled to limit the exposure to others and, the surgeon will be notified. THE PATIENT WILL NEED TO BE ASYMPTOMATIC FOR 14 DAYS.   If the patient is not experiencing any of these symptoms, the PAT nurse will instruct them to NOT bring anyone with them to their appointment since they may have these symptoms or traveled as well.   Please remind your patients and families that hospital visitation restrictions are in effect and the importance of the restrictions.    Patient verbalized understanding of instructions that were given to them at the PAT appointment. Patient was also instructed that they will need to review over the PAT instructions again at home before surgery.

## 2018-11-30 ENCOUNTER — Other Ambulatory Visit (HOSPITAL_COMMUNITY)
Admission: RE | Admit: 2018-11-30 | Discharge: 2018-11-30 | Disposition: A | Discharge status: Home / Self Care | Payer: 59 | Source: Ambulatory Visit | Attending: Neurosurgery | Admitting: Neurosurgery

## 2018-11-30 DIAGNOSIS — Z01812 Encounter for preprocedural laboratory examination: Secondary | ICD-10-CM | POA: Insufficient documentation

## 2018-11-30 DIAGNOSIS — Z20828 Contact with and (suspected) exposure to other viral communicable diseases: Secondary | ICD-10-CM | POA: Insufficient documentation

## 2018-11-30 LAB — SARS CORONAVIRUS 2 (TAT 6-24 HRS): SARS Coronavirus 2: NEGATIVE

## 2018-12-01 ENCOUNTER — Encounter (HOSPITAL_COMMUNITY): Payer: Self-pay | Admitting: Certified Registered Nurse Anesthetist

## 2018-12-01 ENCOUNTER — Other Ambulatory Visit: Payer: Self-pay | Admitting: Neurosurgery

## 2018-12-02 ENCOUNTER — Observation Stay (HOSPITAL_COMMUNITY)
Admission: RE | Admit: 2018-12-02 | Discharge: 2018-12-02 | Disposition: A | Discharge status: Home / Self Care | Payer: 59 | Attending: Neurosurgery | Admitting: Neurosurgery

## 2018-12-02 ENCOUNTER — Ambulatory Visit (HOSPITAL_COMMUNITY): Payer: 59 | Admitting: Certified Registered Nurse Anesthetist

## 2018-12-02 ENCOUNTER — Other Ambulatory Visit: Payer: Self-pay

## 2018-12-02 ENCOUNTER — Ambulatory Visit (HOSPITAL_COMMUNITY): Payer: 59

## 2018-12-02 ENCOUNTER — Encounter (HOSPITAL_COMMUNITY): Payer: Self-pay

## 2018-12-02 ENCOUNTER — Encounter (HOSPITAL_COMMUNITY)
Admission: RE | Disposition: A | Discharge status: Home / Self Care | Payer: Self-pay | Source: Home / Self Care | Attending: Neurosurgery

## 2018-12-02 DIAGNOSIS — Z419 Encounter for procedure for purposes other than remedying health state, unspecified: Secondary | ICD-10-CM

## 2018-12-02 DIAGNOSIS — Z79899 Other long term (current) drug therapy: Secondary | ICD-10-CM | POA: Diagnosis not present

## 2018-12-02 DIAGNOSIS — M5020 Other cervical disc displacement, unspecified cervical region: Secondary | ICD-10-CM | POA: Diagnosis present

## 2018-12-02 DIAGNOSIS — M50123 Cervical disc disorder at C6-C7 level with radiculopathy: Secondary | ICD-10-CM | POA: Insufficient documentation

## 2018-12-02 DIAGNOSIS — M4722 Other spondylosis with radiculopathy, cervical region: Principal | ICD-10-CM | POA: Insufficient documentation

## 2018-12-02 DIAGNOSIS — Z981 Arthrodesis status: Secondary | ICD-10-CM | POA: Diagnosis not present

## 2018-12-02 DIAGNOSIS — M5412 Radiculopathy, cervical region: Secondary | ICD-10-CM | POA: Diagnosis not present

## 2018-12-02 HISTORY — PX: POSTERIOR CERVICAL LAMINECTOMY: SHX2248

## 2018-12-02 SURGERY — POSTERIOR CERVICAL LAMINECTOMY
Anesthesia: General | Laterality: Left

## 2018-12-02 MED ORDER — HYDROCODONE-ACETAMINOPHEN 5-325 MG PO TABS: 1.0000 | ORAL_TABLET | ORAL | Status: DC | PRN

## 2018-12-02 MED ORDER — ONDANSETRON HCL 4 MG PO TABS: 4.0000 mg | ORAL_TABLET | Freq: Four times a day (QID) | ORAL | Status: DC | PRN

## 2018-12-02 MED ORDER — FENTANYL CITRATE (PF) 100 MCG/2ML IJ SOLN
INTRAMUSCULAR | Status: DC | PRN
  Administered 2018-12-02 (×3): 50 ug via INTRAVENOUS
  Administered 2018-12-02: 100 ug via INTRAVENOUS

## 2018-12-02 MED ORDER — HYDROXYZINE HCL 50 MG/ML IM SOLN: 50.0000 mg | INTRAMUSCULAR | Status: DC | PRN

## 2018-12-02 MED ORDER — HYDROMORPHONE HCL 1 MG/ML IJ SOLN
INTRAMUSCULAR | Status: AC
  Administered 2018-12-02: 0.25 mg via INTRAVENOUS
  Filled 2018-12-02: qty 1

## 2018-12-02 MED ORDER — ARTIFICIAL TEARS OPHTHALMIC OINT
TOPICAL_OINTMENT | OPHTHALMIC | Status: AC
  Filled 2018-12-02: qty 3.5

## 2018-12-02 MED ORDER — MAGNESIUM HYDROXIDE 400 MG/5ML PO SUSP: 30.0000 mL | Freq: Every day | ORAL | Status: DC | PRN

## 2018-12-02 MED ORDER — ROCURONIUM BROMIDE 10 MG/ML (PF) SYRINGE
PREFILLED_SYRINGE | INTRAVENOUS | Status: AC
  Filled 2018-12-02: qty 10

## 2018-12-02 MED ORDER — THROMBIN 5000 UNITS EX SOLR
OROMUCOSAL | Status: DC | PRN
  Administered 2018-12-02: 5 mL via TOPICAL

## 2018-12-02 MED ORDER — PROPOFOL 10 MG/ML IV BOLUS
INTRAVENOUS | Status: AC
  Filled 2018-12-02: qty 40

## 2018-12-02 MED ORDER — CHLORHEXIDINE GLUCONATE CLOTH 2 % EX PADS: 6.0000 | MEDICATED_PAD | Freq: Once | CUTANEOUS | Status: DC

## 2018-12-02 MED ORDER — ACETAMINOPHEN 325 MG PO TABS: 650.0000 mg | ORAL_TABLET | ORAL | Status: DC | PRN

## 2018-12-02 MED ORDER — THROMBIN 5000 UNITS EX SOLR
CUTANEOUS | Status: AC
  Filled 2018-12-02: qty 15000

## 2018-12-02 MED ORDER — HYDROCODONE-ACETAMINOPHEN 5-325 MG PO TABS: 1.0000 | ORAL_TABLET | ORAL | 0 refills | Status: DC | PRN

## 2018-12-02 MED ORDER — KETOROLAC TROMETHAMINE 30 MG/ML IJ SOLN
INTRAMUSCULAR | Status: AC
  Filled 2018-12-02: qty 1

## 2018-12-02 MED ORDER — ROCURONIUM BROMIDE 100 MG/10ML IV SOLN
INTRAVENOUS | Status: DC | PRN
  Administered 2018-12-02: 10 mg via INTRAVENOUS
  Administered 2018-12-02: 80 mg via INTRAVENOUS

## 2018-12-02 MED ORDER — CYCLOBENZAPRINE HCL 5 MG PO TABS: 5.0000 mg | ORAL_TABLET | Freq: Three times a day (TID) | ORAL | Status: DC | PRN

## 2018-12-02 MED ORDER — ONDANSETRON HCL 4 MG/2ML IJ SOLN: 4.0000 mg | Freq: Four times a day (QID) | INTRAMUSCULAR | Status: DC | PRN

## 2018-12-02 MED ORDER — HYDROMORPHONE HCL 1 MG/ML IJ SOLN
0.2500 mg | INTRAMUSCULAR | Status: DC | PRN
  Administered 2018-12-02: 10:00:00 0.25 mg via INTRAVENOUS
  Administered 2018-12-02: 0.5 mg via INTRAVENOUS

## 2018-12-02 MED ORDER — SODIUM CHLORIDE 0.9 % IV SOLN
INTRAVENOUS | Status: DC | PRN
  Administered 2018-12-02: 500 mL

## 2018-12-02 MED ORDER — CEFAZOLIN SODIUM-DEXTROSE 2-4 GM/100ML-% IV SOLN
INTRAVENOUS | Status: AC
  Filled 2018-12-02: qty 100

## 2018-12-02 MED ORDER — FLEET ENEMA 7-19 GM/118ML RE ENEM: 1.0000 | ENEMA | Freq: Once | RECTAL | Status: DC | PRN

## 2018-12-02 MED ORDER — FENTANYL CITRATE (PF) 250 MCG/5ML IJ SOLN
INTRAMUSCULAR | Status: AC
  Filled 2018-12-02: qty 5

## 2018-12-02 MED ORDER — HYDROXYZINE HCL 25 MG PO TABS: 50.0000 mg | ORAL_TABLET | ORAL | Status: DC | PRN

## 2018-12-02 MED ORDER — LIDOCAINE-EPINEPHRINE 1 %-1:100000 IJ SOLN
INTRAMUSCULAR | Status: DC | PRN
  Administered 2018-12-02: 8 mL

## 2018-12-02 MED ORDER — SODIUM CHLORIDE 0.9% FLUSH: 3.0000 mL | Freq: Two times a day (BID) | INTRAVENOUS | Status: DC

## 2018-12-02 MED ORDER — SODIUM CHLORIDE 0.9% FLUSH: 3.0000 mL | INTRAVENOUS | Status: DC | PRN

## 2018-12-02 MED ORDER — METHYLPREDNISOLONE ACETATE 80 MG/ML IJ SUSP
INTRAMUSCULAR | Status: AC
  Filled 2018-12-02: qty 1

## 2018-12-02 MED ORDER — DEXAMETHASONE SODIUM PHOSPHATE 10 MG/ML IJ SOLN
INTRAMUSCULAR | Status: AC
  Filled 2018-12-02: qty 1

## 2018-12-02 MED ORDER — KCL IN DEXTROSE-NACL 20-5-0.45 MEQ/L-%-% IV SOLN: INTRAVENOUS | Status: DC

## 2018-12-02 MED ORDER — MIDAZOLAM HCL 2 MG/2ML IJ SOLN
INTRAMUSCULAR | Status: AC
  Filled 2018-12-02: qty 2

## 2018-12-02 MED ORDER — DEXAMETHASONE SODIUM PHOSPHATE 10 MG/ML IJ SOLN
INTRAMUSCULAR | Status: DC | PRN
  Administered 2018-12-02: 10 mg via INTRAVENOUS

## 2018-12-02 MED ORDER — KETOROLAC TROMETHAMINE 30 MG/ML IJ SOLN
30.0000 mg | Freq: Four times a day (QID) | INTRAMUSCULAR | Status: DC
  Administered 2018-12-02: 16:00:00 30 mg via INTRAVENOUS
  Filled 2018-12-02: qty 1

## 2018-12-02 MED ORDER — ONDANSETRON HCL 4 MG/2ML IJ SOLN
INTRAMUSCULAR | Status: AC
  Filled 2018-12-02: qty 2

## 2018-12-02 MED ORDER — ONDANSETRON HCL 4 MG/2ML IJ SOLN: 4.0000 mg | Freq: Once | INTRAMUSCULAR | Status: DC | PRN

## 2018-12-02 MED ORDER — MEPERIDINE HCL 25 MG/ML IJ SOLN: 6.2500 mg | INTRAMUSCULAR | Status: DC | PRN

## 2018-12-02 MED ORDER — KETOROLAC TROMETHAMINE 30 MG/ML IJ SOLN
30.0000 mg | Freq: Once | INTRAMUSCULAR | Status: AC
  Administered 2018-12-02: 10:00:00 30 mg via INTRAVENOUS

## 2018-12-02 MED ORDER — PROPOFOL 10 MG/ML IV BOLUS
INTRAVENOUS | Status: DC | PRN
  Administered 2018-12-02: 130 mg via INTRAVENOUS
  Administered 2018-12-02: 50 mg via INTRAVENOUS

## 2018-12-02 MED ORDER — ONDANSETRON HCL 4 MG/2ML IJ SOLN
INTRAMUSCULAR | Status: DC | PRN
  Administered 2018-12-02: 4 mg via INTRAVENOUS

## 2018-12-02 MED ORDER — ARTIFICIAL TEARS OPHTHALMIC OINT
TOPICAL_OINTMENT | OPHTHALMIC | Status: DC | PRN
  Administered 2018-12-02: 1 via OPHTHALMIC

## 2018-12-02 MED ORDER — BUPIVACAINE HCL (PF) 0.25 % IJ SOLN
INTRAMUSCULAR | Status: DC | PRN
  Administered 2018-12-02: 8 mL

## 2018-12-02 MED ORDER — BUPIVACAINE HCL (PF) 0.5 % IJ SOLN
INTRAMUSCULAR | Status: AC
  Filled 2018-12-02: qty 30

## 2018-12-02 MED ORDER — BISACODYL 10 MG RE SUPP: 10.0000 mg | Freq: Every day | RECTAL | Status: DC | PRN

## 2018-12-02 MED ORDER — METHYLPREDNISOLONE ACETATE 80 MG/ML IJ SUSP
INTRAMUSCULAR | Status: DC | PRN
  Administered 2018-12-02: 40 mg

## 2018-12-02 MED ORDER — MIDAZOLAM HCL 5 MG/5ML IJ SOLN
INTRAMUSCULAR | Status: DC | PRN
  Administered 2018-12-02: 2 mg via INTRAVENOUS

## 2018-12-02 MED ORDER — MENTHOL 3 MG MT LOZG: 1.0000 | LOZENGE | OROMUCOSAL | Status: DC | PRN

## 2018-12-02 MED ORDER — LIDOCAINE 2% (20 MG/ML) 5 ML SYRINGE
INTRAMUSCULAR | Status: AC
  Filled 2018-12-02: qty 5

## 2018-12-02 MED ORDER — SUGAMMADEX SODIUM 200 MG/2ML IV SOLN
INTRAVENOUS | Status: DC | PRN
  Administered 2018-12-02: 200 mg via INTRAVENOUS

## 2018-12-02 MED ORDER — LACTATED RINGERS IV SOLN
INTRAVENOUS | Status: DC | PRN
  Administered 2018-12-02 (×2): via INTRAVENOUS

## 2018-12-02 MED ORDER — THROMBIN 5000 UNITS EX SOLR
CUTANEOUS | Status: DC | PRN
  Administered 2018-12-02 (×2): 5000 [IU] via TOPICAL

## 2018-12-02 MED ORDER — BACITRACIN ZINC 500 UNIT/GM EX OINT
TOPICAL_OINTMENT | CUTANEOUS | Status: AC
  Filled 2018-12-02: qty 28.35

## 2018-12-02 MED ORDER — ACETAMINOPHEN 10 MG/ML IV SOLN
INTRAVENOUS | Status: DC | PRN
  Administered 2018-12-02: 1000 mg via INTRAVENOUS

## 2018-12-02 MED ORDER — PHENOL 1.4 % MT LIQD: 1.0000 | OROMUCOSAL | Status: DC | PRN

## 2018-12-02 MED ORDER — ALUM & MAG HYDROXIDE-SIMETH 200-200-20 MG/5ML PO SUSP: 30.0000 mL | Freq: Four times a day (QID) | ORAL | Status: DC | PRN

## 2018-12-02 MED ORDER — GABAPENTIN 100 MG PO CAPS
100.0000 mg | ORAL_CAPSULE | Freq: Three times a day (TID) | ORAL | Status: DC
  Administered 2018-12-02: 16:00:00 100 mg via ORAL
  Filled 2018-12-02: qty 1

## 2018-12-02 MED ORDER — LIDOCAINE 2% (20 MG/ML) 5 ML SYRINGE
INTRAMUSCULAR | Status: DC | PRN
  Administered 2018-12-02: 100 mg via INTRAVENOUS

## 2018-12-02 MED ORDER — SODIUM CHLORIDE 0.9 % IV SOLN: 250.0000 mL | INTRAVENOUS | Status: DC

## 2018-12-02 MED ORDER — CEFAZOLIN SODIUM-DEXTROSE 2-4 GM/100ML-% IV SOLN
2.0000 g | INTRAVENOUS | Status: AC
  Administered 2018-12-02: 08:00:00 2 g via INTRAVENOUS

## 2018-12-02 MED ORDER — LIDOCAINE-EPINEPHRINE 1 %-1:100000 IJ SOLN
INTRAMUSCULAR | Status: AC
  Filled 2018-12-02: qty 1

## 2018-12-02 MED ORDER — MORPHINE SULFATE (PF) 4 MG/ML IV SOLN: 4.0000 mg | INTRAVENOUS | Status: DC | PRN

## 2018-12-02 MED ORDER — ACETAMINOPHEN 650 MG RE SUPP: 650.0000 mg | RECTAL | Status: DC | PRN

## 2018-12-02 MED FILL — HYDROCODON-APAP 5-325: 5-325 | 3 days supply | Qty: 30 | Fill #0

## 2018-12-02 SURGICAL SUPPLY — 67 items
BAG DECANTER FOR FLEXI CONT (MISCELLANEOUS) ×2 IMPLANT
BENZOIN TINCTURE PRP APPL 2/3 (GAUZE/BANDAGES/DRESSINGS) IMPLANT
BIT DRILL WIRE PASS 1.3MM (BIT) ×1 IMPLANT
BLADE CLIPPER SURG (BLADE) IMPLANT
BLADE SURG 11 STRL SS (BLADE) ×2 IMPLANT
BLADE ULTRA TIP 2M (BLADE) ×1 IMPLANT
BUR MATCHSTICK NEURO 3.0 LAGG (BURR) ×2 IMPLANT
BUR NEURO DIAMOND 3X3.8 (BURR) IMPLANT
BURR NEURO DIAMOND 3X3.8 (BURR)
CANISTER SUCT 3000ML PPV (MISCELLANEOUS) ×2 IMPLANT
CARTRIDGE OIL MAESTRO DRILL (MISCELLANEOUS) ×1 IMPLANT
COVER WAND RF STERILE (DRAPES) ×2 IMPLANT
DERMABOND ADVANCED (GAUZE/BANDAGES/DRESSINGS) ×1
DERMABOND ADVANCED .7 DNX12 (GAUZE/BANDAGES/DRESSINGS) ×1 IMPLANT
DIFFUSER DRILL AIR PNEUMATIC (MISCELLANEOUS) ×2 IMPLANT
DRAPE LAPAROTOMY 100X72 PEDS (DRAPES) ×2 IMPLANT
DRAPE MICROSCOPE LEICA (MISCELLANEOUS) ×2 IMPLANT
DRAPE POUCH INSTRU U-SHP 10X18 (DRAPES) ×1 IMPLANT
DRILL WIRE PASS 1.3MM (BIT)
ELECT REM PT RETURN 9FT ADLT (ELECTROSURGICAL) ×2
ELECTRODE REM PT RTRN 9FT ADLT (ELECTROSURGICAL) ×1 IMPLANT
GAUZE 4X4 16PLY RFD (DISPOSABLE) IMPLANT
GAUZE SPONGE 4X4 12PLY STRL (GAUZE/BANDAGES/DRESSINGS) IMPLANT
GAUZE SPONGE 4X4 12PLY STRL LF (GAUZE/BANDAGES/DRESSINGS) ×1 IMPLANT
GLOVE BIOGEL PI IND STRL 7.5 (GLOVE) IMPLANT
GLOVE BIOGEL PI IND STRL 8 (GLOVE) ×1 IMPLANT
GLOVE BIOGEL PI INDICATOR 7.5 (GLOVE) ×3
GLOVE BIOGEL PI INDICATOR 8 (GLOVE) ×1
GLOVE ECLIPSE 7.5 STRL STRAW (GLOVE) ×3 IMPLANT
GLOVE EXAM NITRILE XL STR (GLOVE) IMPLANT
GLOVE SURG SS PI 7.0 STRL IVOR (GLOVE) ×3 IMPLANT
GOWN STRL REUS W/ TWL LRG LVL3 (GOWN DISPOSABLE) IMPLANT
GOWN STRL REUS W/ TWL XL LVL3 (GOWN DISPOSABLE) IMPLANT
GOWN STRL REUS W/TWL 2XL LVL3 (GOWN DISPOSABLE) IMPLANT
GOWN STRL REUS W/TWL LRG LVL3 (GOWN DISPOSABLE) ×1
GOWN STRL REUS W/TWL XL LVL3 (GOWN DISPOSABLE)
HEMOSTAT POWDER KIT SURGIFOAM (HEMOSTASIS) ×1 IMPLANT
HEMOSTAT SURGICEL 2X14 (HEMOSTASIS) IMPLANT
KIT BASIN OR (CUSTOM PROCEDURE TRAY) ×2 IMPLANT
KIT TURNOVER KIT B (KITS) ×2 IMPLANT
NDL HYPO 18GX1.5 BLUNT FILL (NEEDLE) IMPLANT
NDL SPNL 18GX3.5 QUINCKE PK (NEEDLE) ×1 IMPLANT
NDL SPNL 22GX3.5 QUINCKE BK (NEEDLE) ×2 IMPLANT
NEEDLE HYPO 18GX1.5 BLUNT FILL (NEEDLE) ×2 IMPLANT
NEEDLE SPNL 18GX3.5 QUINCKE PK (NEEDLE) ×2 IMPLANT
NEEDLE SPNL 22GX3.5 QUINCKE BK (NEEDLE) ×2 IMPLANT
NS IRRIG 1000ML POUR BTL (IV SOLUTION) ×2 IMPLANT
OIL CARTRIDGE MAESTRO DRILL (MISCELLANEOUS) ×2
PACK LAMINECTOMY NEURO (CUSTOM PROCEDURE TRAY) ×2 IMPLANT
PAD ARMBOARD 7.5X6 YLW CONV (MISCELLANEOUS) ×6 IMPLANT
PATTIES SURGICAL .25X.25 (GAUZE/BANDAGES/DRESSINGS) IMPLANT
RUBBERBAND STERILE (MISCELLANEOUS) ×4 IMPLANT
SPONGE LAP 4X18 RFD (DISPOSABLE) IMPLANT
SPONGE SURGIFOAM ABS GEL SZ50 (HEMOSTASIS) ×2 IMPLANT
STAPLER SKIN PROX WIDE 3.9 (STAPLE) ×2 IMPLANT
STRIP CLOSURE SKIN 1/4X4 (GAUZE/BANDAGES/DRESSINGS) IMPLANT
SUT VIC AB 0 CT1 18XCR BRD8 (SUTURE) ×1 IMPLANT
SUT VIC AB 0 CT1 8-18 (SUTURE) ×1
SUT VIC AB 2-0 CP2 18 (SUTURE) ×2 IMPLANT
SUT VIC AB 3-0 SH 8-18 (SUTURE) ×2 IMPLANT
SYR 5ML LL (SYRINGE) ×1 IMPLANT
TAPE CLOTH SURG 4X10 WHT LF (GAUZE/BANDAGES/DRESSINGS) ×1 IMPLANT
TOWEL GREEN STERILE (TOWEL DISPOSABLE) ×2 IMPLANT
TOWEL GREEN STERILE FF (TOWEL DISPOSABLE) ×2 IMPLANT
TRAY FOLEY MTR SLVR 16FR STAT (SET/KITS/TRAYS/PACK) IMPLANT
UNDERPAD 30X30 (UNDERPADS AND DIAPERS) IMPLANT
WATER STERILE IRR 1000ML POUR (IV SOLUTION) ×2 IMPLANT

## 2018-12-02 NOTE — Discharge Summary (Signed)
Physician Discharge Summary  Patient ID: Robert Charles MRN: [id number] DOB/AGE: 33/24/1987 33 y.o.  Admit date: 12/02/2018 Discharge date: 12/02/2018  Admission Diagnoses:  Left C6-7 HNP, left C7 cervical radiculopathy with left triceps weakness, cervical spondylosis, cervical degenerative disc disease  Discharge Diagnoses:  Left C6-7 HNP, left C7 cervical radiculopathy with left triceps weakness, cervical spondylosis, cervical degenerative disc disease Active Problems:   HNP (herniated nucleus pulposus), cervical   Discharged Condition: good  Hospital Course: Patient was admitted, underwent a left C6-7 cervical laminotomy, foraminotomy, and microdiscectomy.  Postoperatively he has done well.  He is up and ambulating actively.  He is voiding well.  His left upper extremity feels better and he feels the strength is somewhat better.  We went ahead and removed the dressing, and left the wound open to air.  There is no erythema, ecchymosis, swelling, or drainage.  Motor examination shows the biceps are 5 bilaterally left triceps is 4 right triceps is 5.  The left triceps strength is improved as compared to preoperatively.  He has been given instructions regarding wound care and activities.  He is scheduled for follow-up with me in the office in 2 weeks.  Discharge Exam: Blood pressure 131/79, pulse (!) 54, temperature 97.8 F (36.6 C), temperature source Oral, resp. rate 16, height 6\' 2"  (1.88 m), weight 79.3 kg, SpO2 99 %.  Disposition: Discharge disposition: 01-Home or Self Care       Discharge Instructions    Discharge wound care:   Complete by: As directed    Leave the wound open to air. Shower daily with the wound uncovered. Water and soapy water should run over the incision area. Do not wash directly on the incision for 2 weeks. Remove the glue after 2 weeks.   Driving Restrictions   Complete by: As directed    No driving for 2 weeks. May ride in the car locally now. May begin to  drive locally in 2 weeks.   Other Restrictions   Complete by: As directed    Walk gradually increasing distances out in the fresh air at least twice a day. Walking additional 6 times inside the house, gradually increasing distances, daily. No bending, lifting, or twisting. Perform activities between shoulder and waist height (that is at counter height when standing or table height when sitting).     Allergies as of 12/02/2018   No Known Allergies     Medication List    STOP taking these medications   predniSONE 20 MG tablet Commonly known as: DELTASONE     TAKE these medications   cyclobenzaprine 10 MG tablet Commonly known as: FLEXERIL Take 1 tablet (10 mg total) by mouth at bedtime as needed for muscle spasms.   gabapentin 100 MG capsule Commonly known as: NEURONTIN Take 1 capsule (100 mg total) by mouth 3 (three) times daily.   HYDROcodone-acetaminophen 5-325 MG tablet Commonly known as: NORCO/VICODIN Take 1-2 tablets by mouth every 4 (four) hours as needed for moderate pain.   ibuprofen 200 MG tablet Commonly known as: ADVIL Take 400 mg by mouth every 6 (six) hours as needed for headache or moderate pain.            Discharge Care Instructions  (From admission, onward)         Start     Ordered   12/02/18 0000  Discharge wound care:    Comments: Leave the wound open to air. Shower daily with the wound uncovered. Water and soapy water should  run over the incision area. Do not wash directly on the incision for 2 weeks. Remove the glue after 2 weeks.   12/02/18 1744         Follow-up Information    Shirlean Kelly, MD Follow up.   Specialty: Neurosurgery Contact information: [address] [place] [address] [phone]           Signed: Hewitt Shorts 12/02/2018, 5:45 PM

## 2018-12-02 NOTE — Anesthesia Postprocedure Evaluation (Signed)
Anesthesia Post Note  Patient: Robert Charles  Procedure(s) Performed: Left Cervical Six-Cervical Seven  cervical laminotomy, foraminotomy, and microdiscectomy (Left )     Patient location during evaluation: PACU Anesthesia Type: General Level of consciousness: awake and alert Pain management: pain level controlled Vital Signs Assessment: post-procedure vital signs reviewed and stable Respiratory status: spontaneous breathing, nonlabored ventilation, respiratory function stable and patient connected to nasal cannula oxygen Cardiovascular status: blood pressure returned to baseline and stable Postop Assessment: no apparent nausea or vomiting Anesthetic complications: no    Last Vitals:  Vitals:   12/02/18 1100 12/02/18 1102  BP: 140/90 140/90  Pulse: (!) 52 (!) 52  Resp:  18  Temp: 36.6 C 36.6 C  SpO2: 98%     Last Pain:  Vitals:   12/02/18 1108  TempSrc:   PainSc: 3                  Addysyn Fern DAVID

## 2018-12-02 NOTE — H&P (Signed)
Subjective: Patient is a 33 y.o. right-handed male who is admitted for treatment of left C6-7 cervical disc herniation with underlying spondylosis and resulting left C7 cervical radiculopathy with left triceps weakness.  Patient symptoms developed nearly 2 months ago with neck pain and pain rating down through the left upper extremity.  Coughing and sneezing aggravate the pain.  Exam shows weakness of the left triceps.  MRI scan shows at the C6-7 level osteophytic encroachment with probable superimposed disc condition laterally into the left C6-7 neural foramen, corresponding to his left C7 radiculopathy with left triceps weakness.  We discussed a number of surgical treatment options including anterior approaches including anterior cervical decompression arthrodesis as well as anterior cervical disc arthroplasty, however patient's preference is for a posterior cervical approach and he has been scheduled for a left C6-7 cervical laminotomy, foraminotomy, and microdiscectomy.  We discussed extensively the advantages and disadvantages of each approach.   Past Medical History:  Diagnosis Date  . Bradycardia    Resting HR in 30s at times  . Family history of adverse reaction to anesthesia    Grandfather gets bradycardic    Past Surgical History:  Procedure Laterality Date  . ANKLE SURGERY Right reconstruction   2005  . wisdom tooth removal      Medications Prior to Admission  Medication Sig Dispense Refill Last Dose  . gabapentin (NEURONTIN) 100 MG capsule Take 1 capsule (100 mg total) by mouth 3 (three) times daily. 90 capsule 3 12/02/2018 at 0445  . ibuprofen (ADVIL) 200 MG tablet Take 400 mg by mouth every 6 (six) hours as needed for headache or moderate pain.   Past Month at Unknown time  . cyclobenzaprine (FLEXERIL) 10 MG tablet Take 1 tablet (10 mg total) by mouth at bedtime as needed for muscle spasms. (Patient not taking: Reported on 11/24/2018) 30 tablet 0 Completed Course at Unknown time  .  predniSONE (DELTASONE) 20 MG tablet 2 tabs po for 7 days, then 1 tab po for 7 days (Patient not taking: Reported on 11/24/2018) 21 tablet 0 Completed Course at Unknown time   No Known Allergies  Social History   Tobacco Use  . Smoking status: Never Smoker  . Smokeless tobacco: Never Used  Substance Use Topics  . Alcohol use: Yes    Alcohol/week: 10.0 standard drinks    Types: 10 Standard drinks or equivalent per week    Family History  Problem Relation Age of Onset  . Heart disease Father      Review of Systems Pertinent items noted in HPI and remainder of comprehensive ROS otherwise negative.  Objective: Vital signs in last 24 hours: Temp:  [97.7 F (36.5 C)] 97.7 F (36.5 C) (10/08 0701) Pulse Rate:  [68] 68 (10/08 0701) Resp:  [18] 18 (10/08 0701) BP: (136)/(83) 136/83 (10/08 0701) SpO2:  [100 %] 100 % (10/08 0701) Weight:  [79.3 kg] 79.3 kg (10/08 0630)  EXAM: Patient is a well-developed well-nourished white male in no acute distress.   Lungs are clear to auscultation , the patient has symmetrical respiratory excursion. Heart has a regular rate and rhythm normal S1 and S2 no murmur.   Abdomen is soft nontender nondistended bowel sounds are present. Extremity examination shows no clubbing cyanosis or edema. Motor examination shows 5/5 strength in the deltoid and biceps bilaterally, the right triceps, and the intrinsics and grip bilaterally, however the left triceps is 3 to 4-/5.  Sensation is intact to pinprick throughout the digits of the upper extremities.  Reflex examination shows the biceps and brachioradialis are minimal bilaterally, left triceps is absent, right triceps is 1-2.  Quadricep and gastrocnemius are 2 bilaterally.  Toes are downgoing belly.  He has a normal gait and stance.   Data Review:CBC    Component Value Date/Time   WBC 4.4 11/29/2018 0840   RBC 5.06 11/29/2018 0840   HGB 15.0 11/29/2018 0840   HCT 45.3 11/29/2018 0840   PLT 240 11/29/2018 0840    MCV 89.5 11/29/2018 0840   MCH 29.6 11/29/2018 0840   MCHC 33.1 11/29/2018 0840   RDW 11.7 11/29/2018 0840                          BMET    Component Value Date/Time   NA 141 11/29/2018 0840   K 3.9 11/29/2018 0840   CL 105 11/29/2018 0840   CO2 27 11/29/2018 0840   GLUCOSE 103 (H) 11/29/2018 0840   BUN 13 11/29/2018 0840   CREATININE 1.11 11/29/2018 0840   CALCIUM 9.7 11/29/2018 0840   GFRNONAA >60 11/29/2018 0840   GFRAA >60 11/29/2018 0840     Assessment/Plan: Patient with neck pain and left C7 cervical radiculopathy with left triceps weakness with left C6-7 HNP with underlying cervical spondylosis who is admitted now for a left C6-7 cervical laminotomy, foraminotomy, and microdiscectomy.  I've discussed with the patient the nature of his condition, the nature the surgical procedure, the typical length of surgery, hospital stay, and overall recuperation. We discussed limitations postoperatively. I discussed risks of surgery including risks of infection, bleeding, possibly need for transfusion, the risk of nerve root dysfunction with pain, weakness, numbness, or paresthesias, the risk of spinal cord dysfunction with paralysis of all 4 limbs and quadriplegia, and the risk of dural tear and CSF leakage and possible need for further surgery, the possible need for further surgery, and the risk of anesthetic complications including myocardial infarction, stroke, pneumonia, and death. We also discussed the need for postoperative immobilization in a cervical collar. Understanding all this the patient does wish to proceed with surgery and is admitted for such.  Hosie Spangle, MD 12/02/2018 7:03 AM

## 2018-12-02 NOTE — Anesthesia Procedure Notes (Signed)
Procedure Name: Intubation Date/Time: 12/02/2018 7:38 AM Performed by: Candis Shine, CRNA Pre-anesthesia Checklist: Patient identified, Emergency Drugs available, Suction available and Patient being monitored Patient Re-evaluated:Patient Re-evaluated prior to induction Oxygen Delivery Method: Circle System Utilized Preoxygenation: Pre-oxygenation with 100% oxygen Induction Type: IV induction Ventilation: Mask ventilation without difficulty Laryngoscope Size: Mac and 4 Grade View: Grade I Tube type: Oral Tube size: 7.5 mm Number of attempts: 1 Airway Equipment and Method: Stylet Placement Confirmation: ETT inserted through vocal cords under direct vision,  positive ETCO2 and breath sounds checked- equal and bilateral Secured at: 21 (at teeth) cm Tube secured with: Tape Dental Injury: Teeth and Oropharynx as per pre-operative assessment

## 2018-12-02 NOTE — Op Note (Signed)
12/02/2018  9:33 AM  PATIENT:  Robert Charles  33 y.o. male  PRE-OPERATIVE DIAGNOSIS: Left C6-7 HNP, left C7 cervical radiculopathy with left triceps weakness, cervical spondylosis, cervical degenerative disc disease  POST-OPERATIVE DIAGNOSIS:  Left C6-7 HNP, left C7 cervical radiculopathy with left triceps weakness, cervical spondylosis, cervical degenerative disc disease  PROCEDURE:  Procedure(s):  Left Cervical Six-Cervical Seven  cervical laminotomy, medial facetectomy, foraminotomy, and microdiscectomy with microdissection, microsurgical technique, and the operating microscope  SURGEON:  Surgeon(s): Jovita Gamma, MD  ANESTHESIA:   general  EBL:  Total I/O In: 1000 [I.V.:1000] Out: 75 [Blood:75]  BLOOD ADMINISTERED:none  COUNT:  Correct per nursing staff  DICTATION: Patient was brought to the operating room, placed under general endotracheal anesthesia.  Radiolucent 3 pin Mayfield head holderwas applied, and the patient was turned to a prone position. The posterior neck, occiput, and upper back were prepped with Betadine soap and solution and draped in a sterile fashion. The midline was infiltrated with local anesthetic with epinephrine.  An x-ray was taken and the C6-7 level was identified.  A midline incision was made over the C6-7 level, and carried down through the subcutaneous tissue. Bipolar cautery and electrocautery used to maintain hemostasis.  Posterior cervical fascia was incised on the left side, and the paracervical musculature was dissected from the spinous processes and lamina in a subperiosteal fashion. Self-retaining retractor was placed.  We identified the left C6-7 interlaminar space.  The operating microscope was draped and brought into the field to provide additional magnification, illumination, and visualization, and the remainder the procedure was performed using microdissection and microsurgical technique.  A left C6-7 laminotomy was performed using the  high-speed drill and Kerrison punches with thin footplates.  The ligamentum flavum was identified and carefully removed and we identified the thecal sac and exiting left C7 nerve root.  The decompression was extended laterally with medial facetectomy and foraminotomy.  We then were able to mobilize the left C7 nerve root rostrally, and identified the posterior annulus and the disc herniation.  The annular fibers were gently opened and using a micropituitary rongeur were able to remove the disc herniation, decompressing the thecal sac and nerve root.  The epidural space was thoroughly examined, and all loose fragments of disc material removed.  We then establish hemostasis with Gelfoam with thrombin.  The Gelfoam was removed, and a thin layer of Surgifoam applied.  Good hemostasis was established.  We then instilled 40 mg of Depo-Medrol into the epidural space.  We then proceeded with closure. Deep fascia was closed with interrupted undyed 0 Vicryl sutures. Scarpa's fascia was closed with interrupted undyed 0 Vicryl sutures. The subcutaneous and subcuticular closed with interrupted inverted 2-0 and 3-0 undyed Vicryl sutures.  Skin edges were closed with Dermabond. The wound was dressed with sterile gauze and Hypafix.  Following surgery the patient was turned back to a supine position, the 3 pin Mayfield head holder was removed, and the patient is to be reversed and the anesthetic, extubated, and transferred to the recovery room for further care.  PLAN OF CARE: Admit for overnight observation  PATIENT DISPOSITION:  PACU - hemodynamically stable.   Delay start of Pharmacological VTE agent (>24hrs) due to surgical blood loss or risk of bleeding:  yes

## 2018-12-02 NOTE — Plan of Care (Signed)
Patient alert and oriented, mae's well, voiding adequate amount of urine, swallowing without difficulty, no c/o pain at time of discharge. Patient discharged home with family. Script and discharged instructions given to patient. Patient and family stated understanding of instructions given. Patient has an appointment with Dr. Nudelman 

## 2018-12-02 NOTE — Discharge Instructions (Signed)
  Call Your Doctor If Any of These Occur Redness, drainage, or swelling at the wound.  Temperature greater than 101 degrees. Severe pain not relieved by pain medication. Increased difficulty swallowing. Incision starts to come apart. Follow Up Appt Call today for appointment in 3 weeks [phone]) or for problems.  If you have any hardware placed in your spine, you will need an x-ray before your appointment.   

## 2018-12-02 NOTE — Anesthesia Preprocedure Evaluation (Signed)
Anesthesia Evaluation  Patient identified by MRN, date of birth, ID band Patient awake    Reviewed: Allergy & Precautions, NPO status , Patient's Chart, lab work & pertinent test results  Airway Mallampati: I  TM Distance: >3 FB Neck ROM: Full    Dental   Pulmonary    Pulmonary exam normal        Cardiovascular Normal cardiovascular exam     Neuro/Psych    GI/Hepatic   Endo/Other    Renal/GU      Musculoskeletal   Abdominal   Peds  Hematology   Anesthesia Other Findings   Reproductive/Obstetrics                             Anesthesia Physical Anesthesia Plan  ASA: II  Anesthesia Plan: General   Post-op Pain Management:    Induction: Intravenous  PONV Risk Score and Plan: 2 and Ondansetron and Treatment may vary due to age or medical condition  Airway Management Planned: Oral ETT  Additional Equipment:   Intra-op Plan:   Post-operative Plan: Extubation in OR  Informed Consent: I have reviewed the patients History and Physical, chart, labs and discussed the procedure including the risks, benefits and alternatives for the proposed anesthesia with the patient or authorized representative who has indicated his/her understanding and acceptance.       Plan Discussed with: CRNA and Surgeon  Anesthesia Plan Comments:         Anesthesia Quick Evaluation  

## 2018-12-02 NOTE — Transfer of Care (Signed)
Immediate Anesthesia Transfer of Care Note  Patient: Robert Charles  Procedure(s) Performed: Left Cervical Six-Cervical Seven  cervical laminotomy, foraminotomy, and microdiscectomy (Left )  Patient Location: PACU  Anesthesia Type:General  Level of Consciousness: awake, alert  and oriented  Airway & Oxygen Therapy: Patient Spontanous Breathing and Patient connected to face mask oxygen  Post-op Assessment: Report given to RN and Post -op Vital signs reviewed and stable  Post vital signs: Reviewed and stable  Last Vitals:  Vitals Value Taken Time  BP    Temp    Pulse 86 12/02/18 0953  Resp 12 12/02/18 0954  SpO2 100 % 12/02/18 0953  Vitals shown include unvalidated device data.  Last Pain:  Vitals:   12/02/18 0630  PainSc: 0-No pain         Complications: No apparent anesthesia complications

## 2018-12-03 ENCOUNTER — Other Ambulatory Visit: Payer: Self-pay | Admitting: Family Medicine

## 2018-12-03 ENCOUNTER — Encounter (HOSPITAL_COMMUNITY): Payer: Self-pay | Admitting: Neurosurgery

## 2018-12-03 MED FILL — CYCLOBENZAPRINE 10 MG TAB: 10 | 30 days supply | Qty: 30 | Fill #0

## 2018-12-03 NOTE — Telephone Encounter (Signed)
Last office visit 10/20/2018 for left cervical radiculopathy.  Last refilled 10/20/2018 for #30 with no refills.  No future appointments.

## 2019-07-13 ENCOUNTER — Other Ambulatory Visit: Payer: Self-pay

## 2019-07-13 ENCOUNTER — Encounter: Payer: Self-pay | Admitting: Family Medicine

## 2019-07-13 ENCOUNTER — Ambulatory Visit: Payer: 59 | Admitting: Family Medicine

## 2019-07-13 VITALS — BP 112/74 | HR 65 | Temp 98.4°F | Ht 73.5 in | Wt 184.5 lb

## 2019-07-13 DIAGNOSIS — M67813 Other specified disorders of tendon, right shoulder: Secondary | ICD-10-CM

## 2019-07-13 DIAGNOSIS — M25611 Stiffness of right shoulder, not elsewhere classified: Secondary | ICD-10-CM

## 2019-07-13 NOTE — Progress Notes (Signed)
Robert T. Copland, MD, [facility] [place], [address]  Phone: [phone]  FAX: [phone]  Robert Charles - 34 y.o. male  MRN [id number]  Date of Birth: [birth year]  Date: 07/13/2019  PCP: Kristen Loader, FNP  Referral: Kristen Loader, FNP  Chief Complaint  Patient presents with  . Shoulder Pain    Right    This visit occurred during the SARS-CoV-2 public health emergency.  Safety protocols were in place, including screening questions prior to the visit, additional usage of staff PPE, and extensive cleaning of exam room while observing appropriate contact time as indicated for disinfecting solutions.   Subjective:   Robert Charles is a 34 y.o. very pleasant male patient with Body mass index is 24.01 kg/m. who presents with the following:  He is having some pain in the right shoulder.  He did get some pain after he threw a football, and this was about 6 weeks ago.  Currently does have some modest decrease of motion and throwing a football baseball does hurt quite a bit.  He is not having any pain going down his arm, abnormal sensation, tingling and no shoulder blade pain.  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  He does have function and strength that is preserved in his right shoulder.  I did see the patient in October 2020 when he was having some cervical radiculopathy.  He is well from this status post operative intervention.  11/2018   PRE-OPERATIVE DIAGNOSIS: Left C6-7 HNP, left C7 cervical radiculopathy with left triceps weakness, cervical spondylosis, cervical degenerative disc disease  POST-OPERATIVE DIAGNOSIS:  Left C6-7 HNP, left C7 cervical radiculopathy with left triceps weakness, cervical spondylosis, cervical degenerative disc disease  PROCEDURE:  Procedure(s):  Left Cervical Six-Cervical Seven  cervical laminotomy, medial facetectomy,  foraminotomy, and microdiscectomy with microdissection, microsurgical technique, and the operating microscope  Pain now in the R shoulder, was throwing a football and ackwardly 6 weeks ago and had some decrease range of motion.  Normal throwing does hurt.   No pain down into the R arm, some abnormal sensation in the R arm.   GIRD, R  Post-injury Baseball player  Review of Systems is noted in the HPI, as appropriate   Objective:   Vitals:   07/13/19 1039  BP: 112/74  Pulse: 65  Temp: 98.4 F (36.9 C)  TempSrc: Temporal  SpO2: 97%  Weight: 184 lb 8 oz (83.7 kg)  Height: 6' 1.5" (1.867 m)    GEN: No acute distress; alert,appropriate. PULM: Breathing comfortably in no respiratory distress PSYCH: Normally interactive.    Right shoulder: Loss of approximately 10 degrees and abduction and flexion.  External rotation approaches that of the contralateral side.  Does have an approximate 20 degree loss of motion compared to the left shoulder.  Does have some pain with terminal internal range of motion.  Strength is 5/5 in all directions Crossover is minimally positive on the right Sulcus testing as well as crank testing There is no crepitus with motion of the shoulder  Michel Bickers and Neer testing is mildly positive Speeds and Yergason's are negative Jobe testing is negative  Radiology: No results found.  Assessment and Plan:     ICD-10-CM   1. Tendinosis of right shoulder  M67.813   2. Shoulder stiffness, right  M25.611    Total encounter time: 30 minutes. On  the day of the patient encounter, this can include review of prior records, labs, and imaging.  Additional time can include counselling, consultation with peer MD in person or by telephone.  This also includes independent review of Radiology.  Probable injury or strain to the rotator cuff when he threw his football now with some modest decreased range of motion.  I think that  GIRD (Glenohumeral Internal Rotational  Deficiency) is playing a role with his impingement.  I am can have him do some range of motion exercises at home as well as some scapular stabilization, and I suspect that he is going to resolve doing this alone.  His body awareness is very good and he is a former competitive baseball player  Follow-up: Return in about 6 weeks (around 08/24/2019).  No orders of the defined types were placed in this encounter.  Medications Discontinued During This Encounter  Medication Reason  . gabapentin (NEURONTIN) 100 MG capsule Completed Course   No orders of the defined types were placed in this encounter.   Signed,  Elpidio Galea. Copland, MD   Outpatient Encounter Medications as of 07/13/2019  Medication Sig  . cyclobenzaprine (FLEXERIL) 10 MG tablet TAKE 1 TABLET BY MOUTH AT BEDTIME AS NEEDED FOR MUSCLE SPASMS.  Marland Kitchen HYDROcodone-acetaminophen (NORCO/VICODIN) 5-325 MG tablet Take 1-2 tablets by mouth every 4 (four) hours as needed for moderate pain.  Marland Kitchen ibuprofen (ADVIL) 200 MG tablet Take 400 mg by mouth every 6 (six) hours as needed for headache or moderate pain.  . [DISCONTINUED] gabapentin (NEURONTIN) 100 MG capsule Take 1 capsule (100 mg total) by mouth 3 (three) times daily.   No facility-administered encounter medications on file as of 07/13/2019.

## 2019-07-13 NOTE — Patient Instructions (Signed)
GIRD (Glenohumeral Internal Rotational Deficiency)  (and external)

## 2019-08-02 DIAGNOSIS — Z0000 Encounter for general adult medical examination without abnormal findings: Secondary | ICD-10-CM | POA: Diagnosis not present

## 2019-08-09 DIAGNOSIS — Z7689 Persons encountering health services in other specified circumstances: Secondary | ICD-10-CM | POA: Diagnosis not present

## 2019-08-25 ENCOUNTER — Other Ambulatory Visit: Payer: Self-pay

## 2019-08-25 ENCOUNTER — Ambulatory Visit: Payer: 59 | Admitting: Family Medicine

## 2019-08-25 ENCOUNTER — Encounter: Payer: Self-pay | Admitting: Family Medicine

## 2019-08-25 VITALS — BP 100/70 | HR 56 | Temp 98.0°F | Ht 73.5 in | Wt 184.0 lb

## 2019-08-25 DIAGNOSIS — M7541 Impingement syndrome of right shoulder: Secondary | ICD-10-CM

## 2019-08-25 DIAGNOSIS — M67813 Other specified disorders of tendon, right shoulder: Secondary | ICD-10-CM

## 2019-08-25 DIAGNOSIS — M25611 Stiffness of right shoulder, not elsewhere classified: Secondary | ICD-10-CM | POA: Diagnosis not present

## 2019-08-25 MED ORDER — CYCLOBENZAPRINE HCL 10 MG PO TABS
10.0000 mg | ORAL_TABLET | Freq: Every evening | ORAL | 0 refills | Status: DC | PRN
  Filled 2020-08-09: qty 60, 60d supply, fill #0

## 2019-08-25 MED ORDER — METHYLPREDNISOLONE ACETATE 40 MG/ML IJ SUSP
80.0000 mg | Freq: Once | INTRAMUSCULAR | Status: AC
  Administered 2019-08-25: 80 mg via INTRA_ARTICULAR

## 2019-08-25 MED FILL — CYCLOBENZAPRINE HCL 10 MG T: 10 | 60 days supply | Qty: 60 | Fill #0

## 2019-08-25 NOTE — Progress Notes (Signed)
Jowana Thumma T. Marsh Heckler, MD, CAQ Sports Medicine  Primary Care and Sports Medicine [facility] [address] [place], [address]  Phone: [phone]  FAX: [phone]  Robert Charles - 34 y.o. male  MRN [id number]  Date of Birth: 16-Apr-1985  Date: 08/25/2019  PCP: Soundra Pilon, FNP  Referral: Soundra Pilon, FNP  Chief Complaint  Patient presents with  . Follow-up    Right Shoulder    This visit occurred during the SARS-CoV-2 public health emergency.  Safety protocols were in place, including screening questions prior to the visit, additional usage of staff PPE, and extensive cleaning of exam room while observing appropriate contact time as indicated for disinfecting solutions.   Subjective:   Robert Charles is a 34 y.o. very pleasant male patient with Body mass index is 23.95 kg/m. who presents with the following:  At his last office visit I felt like he had strained his rotator cuff after he threw a football.  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  Some pain with terminal end-point. No throwing overhead.  Playing golf.  He is not really having any difficulty playing golf.  He does have some pain in the deep shoulder and some pain with abduction.  He is still having some pain with throwing a football or baseball.    07/13/2019 Last OV with Hannah Beat, MD  He is having some pain in the right shoulder.  He did get some pain after he threw a football, and this was about 6 weeks ago.  Currently does have some modest decrease of motion and throwing a football baseball does hurt quite a bit.  He is not having any pain going down his arm, abnormal sensation, tingling and no shoulder blade pain.  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   He does have  function and strength that is preserved in his right shoulder.   I did see the patient in October 2020 when he was having some cervical radiculopathy.  He is well from this status post operative intervention.   11/2018    PRE-OPERATIVE DIAGNOSIS: Left C6-7 HNP, left C7 cervical radiculopathy with left triceps weakness, cervical spondylosis, cervical degenerative disc disease   POST-OPERATIVE DIAGNOSIS:  Left C6-7 HNP, left C7 cervical radiculopathy with left triceps weakness, cervical spondylosis, cervical degenerative disc disease   PROCEDURE:  Procedure(s):  Left Cervical Six-Cervical Seven  cervical laminotomy, medial facetectomy, foraminotomy, and microdiscectomy with microdissection, microsurgical technique, and the operating microscope   Pain now in the R shoulder, was throwing a football and ackwardly 6 weeks ago and had some decrease range of motion.  Normal throwing does hurt.    No pain down into the R arm, some abnormal sensation in the R arm.    Review of Systems is noted in the HPI, as appropriate   Objective:   BP 100/70   Pulse (!) 56   Temp 98 F (36.7 C) (Temporal)   Ht 6' 1.5" (1.867 m)   Wt 184 lb (83.5 kg)   SpO2 98%   BMI 23.95 kg/m    GEN: No acute distress; alert,appropriate. PULM: Breathing comfortably in no respiratory distress PSYCH: Normally interactive.   Shoulder: R Inspection: No muscle wasting or winging Ecchymosis/edema: neg  AC joint,  scapula, clavicle: NT Cervical spine: NT, full ROM Spurling's: neg Abduction: full, 5/5 Flexion: full, 5/5 IR, full, lift-off: 5/5 ER at neutral: full, 5/5 AC crossover: neg Neer: pos Hawkins: pos Drop Test: neg Empty Can: pos Supraspinatus insertion: mild-mod T Bicipital groove: NT Speed's: neg Yergason's: neg Sulcus sign: neg Scapular dyskinesis: none C5-T1 intact  Neuro: Sensation intact Grip 5/5   Radiology: No results found.  Assessment and Plan:     ICD-10-CM   1. Impingement  syndrome of right shoulder  M75.41 methylPREDNISolone acetate (DEPO-MEDROL) injection 80 mg  2. Shoulder stiffness, right  M25.611   3. Tendinosis of right shoulder  M67.813    He is improving somewhat, but he is still having some functional limitation at age 67.  He does have some loss of motion and pain with terminal motion, and this is most notable when throwing a football or baseball.  He was formerly a Development worker, community.  I am going to do a diagnostic and therapeutic injection of steroids into his right shoulder intra-articularly as well as at the subacromial space.  He is well versed in shoulder mechanics and rehab, so I think that he can continue this on his own  Aspiration/Injection Procedure Note Robert Charles 04/20/1985 Date of procedure: 08/25/2019  Procedure: Large Joint Aspiration / Injection of Shoulder, Intraarticular, R Indications: Pain  Procedure Details Verbal consent was obtained from the patient. Risks including infection explained and contrasted with benefits and alternatives. Patient prepped with Chloraprep and Ethyl Chloride used for anesthesia. An intraarticular shoulder injection was performed using the posterior approach. The patient tolerated the procedure well and had decreased pain post injection. No complications. Injection: 4 cc of Lidocaine 1% and 1 mL Depo-Medrol 40 mg. Needle: 21 gauge, 2 inch Medication: Depo-Medrol 40 mg  Aspiration/Injection Procedure Note Robert Charles May 18, 1985 Date of procedure: 08/25/2019  Procedure: Large Joint Aspiration / Injection of Shoulder, Subacromial, R Indications: Pain  Procedure Details Verbal consent was obtained from the patient. Risks (including rare infection), benefits, and alternatives were explained. Patient prepped with Chloraprep and Ethyl Chloride used for anesthesia. The subacromial space was injected using the posterior approach. The patient tolerated the procedure well and had decreased pain post  injection. No complications. Injection: 4 cc of Lidocaine 1% and 1 mL of Depo-Medrol 40 mg. Needle: 22 gauge, 1 1/2 inch Medication: Depo-Medrol 40 mg  Follow-up: Return for 6-8 weeks, no need if better.  Meds ordered this encounter  Medications  . cyclobenzaprine (FLEXERIL) 10 MG tablet    Sig: TAKE 1 TABLET BY MOUTH AT BEDTIME AS NEEDED FOR MUSCLE SPASMS.    Dispense:  60 tablet    Refill:  1  . methylPREDNISolone acetate (DEPO-MEDROL) injection 80 mg   Medications Discontinued During This Encounter  Medication Reason  . cyclobenzaprine (FLEXERIL) 10 MG tablet Reorder   No orders of the defined types were placed in this encounter.   Signed,  Elpidio Galea. Jahne Krukowski, MD   Outpatient Encounter Medications as of 08/25/2019  Medication Sig  . ibuprofen (ADVIL) 200 MG tablet Take 400 mg by mouth every 6 (six) hours as needed for headache or moderate pain.  . cyclobenzaprine (FLEXERIL) 10 MG tablet TAKE 1 TABLET BY MOUTH AT BEDTIME AS NEEDED FOR MUSCLE SPASMS.  Marland Kitchen HYDROcodone-acetaminophen (NORCO/VICODIN) 5-325 MG tablet Take 1-2 tablets by mouth every 4 (four) hours as needed for moderate pain. (Patient not taking: Reported on 08/25/2019)  . [DISCONTINUED] cyclobenzaprine (FLEXERIL) 10 MG tablet TAKE 1 TABLET BY  MOUTH AT BEDTIME AS NEEDED FOR MUSCLE SPASMS. (Patient not taking: Reported on 08/25/2019)  . [EXPIRED] methylPREDNISolone acetate (DEPO-MEDROL) injection 80 mg    No facility-administered encounter medications on file as of 08/25/2019.

## 2019-10-06 ENCOUNTER — Ambulatory Visit: Payer: 59 | Admitting: Family Medicine

## 2020-08-09 ENCOUNTER — Other Ambulatory Visit (HOSPITAL_COMMUNITY): Payer: Self-pay

## 2021-02-12 IMAGING — DX CERVICAL SPINE - COMPLETE 4+ VIEW
6 series · 6 of 6 positions shown · non-contrast
Comparison: None.

CLINICAL DATA: Acute left-sided cervical radiculopathy

EXAM:
CERVICAL SPINE - COMPLETE 4+ VIEW

[c-spine lat]
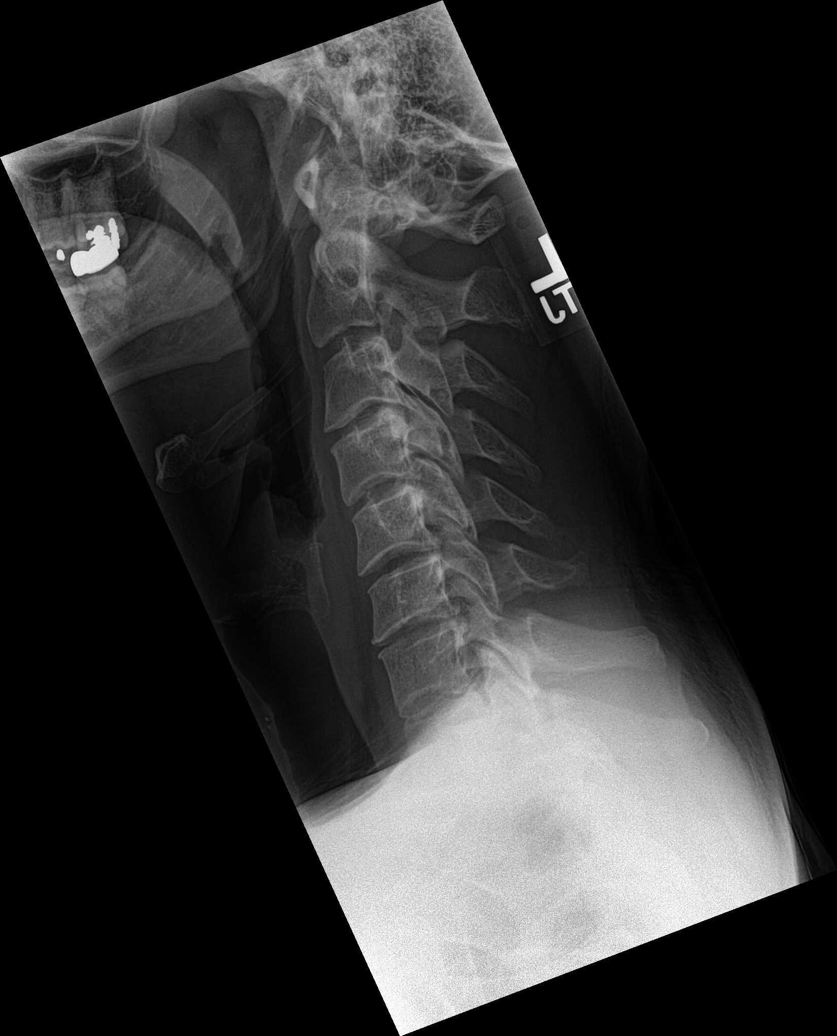

[c-spine obl (1 of 2)]
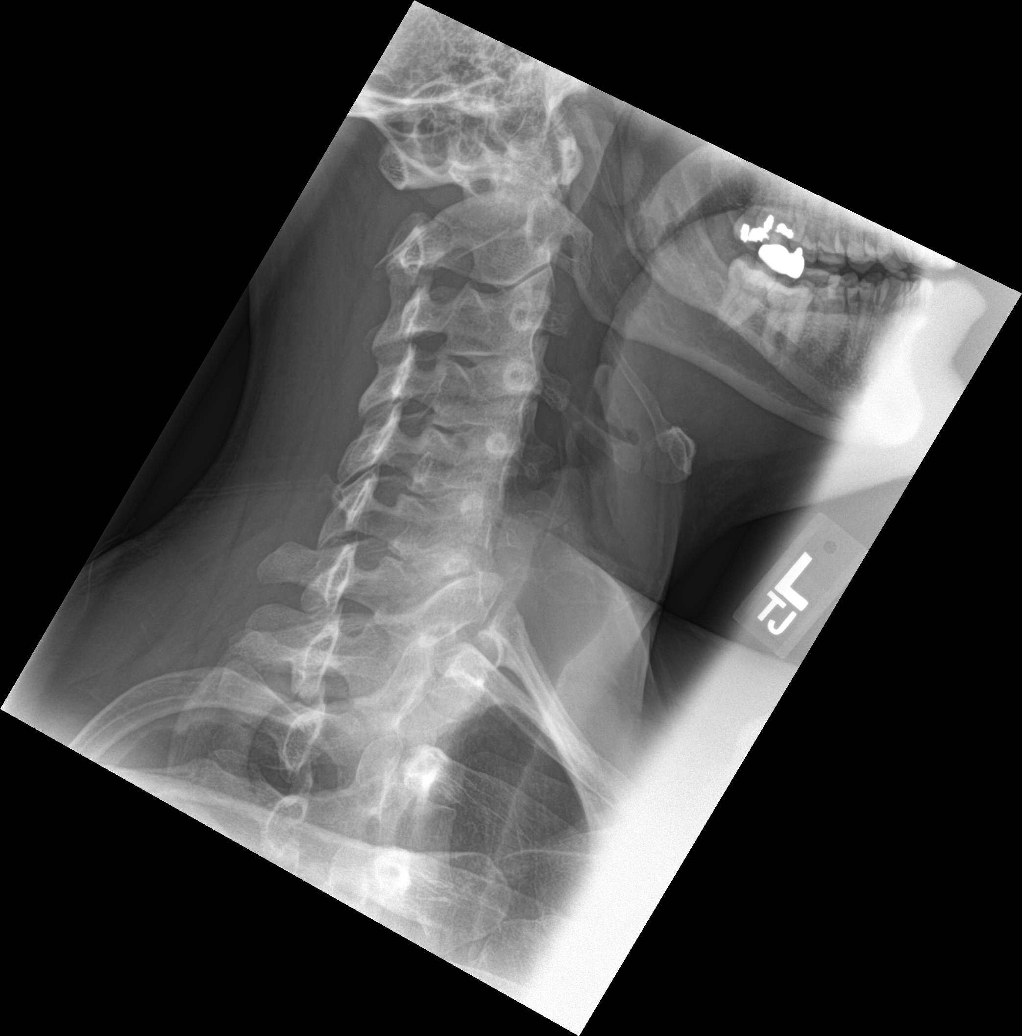

[c-spine obl (2 of 2)]
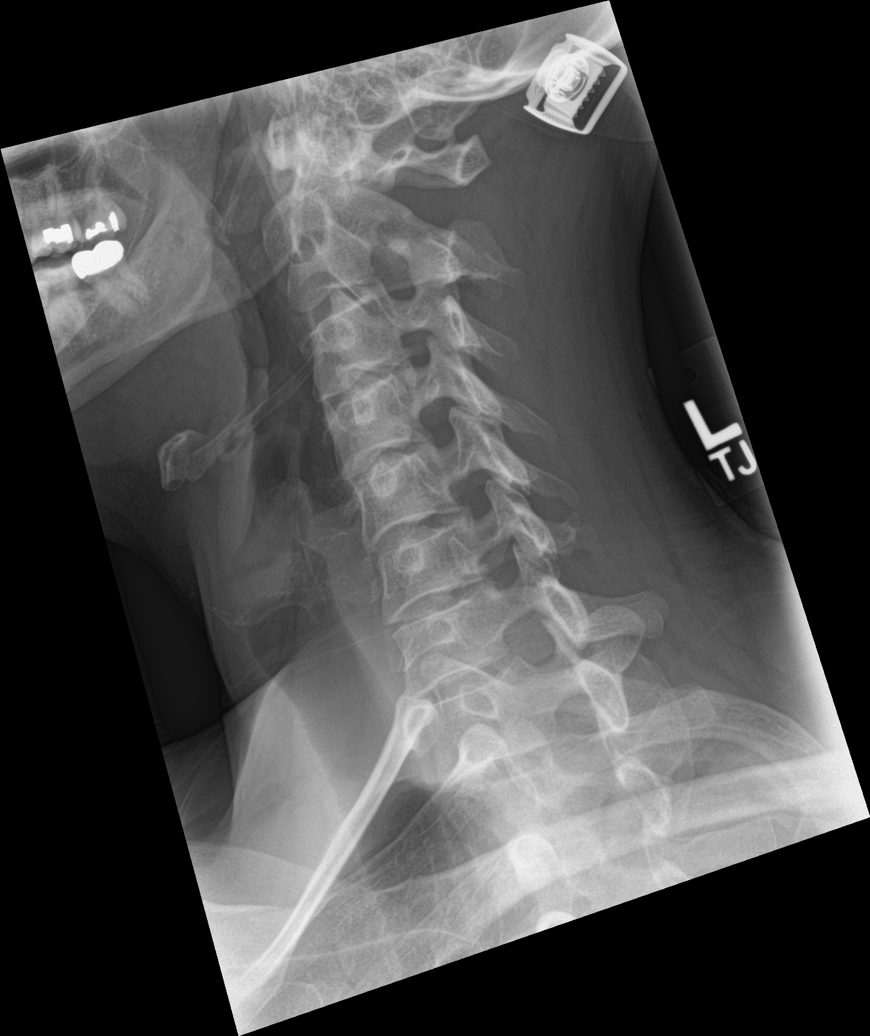

[c-spine ap]
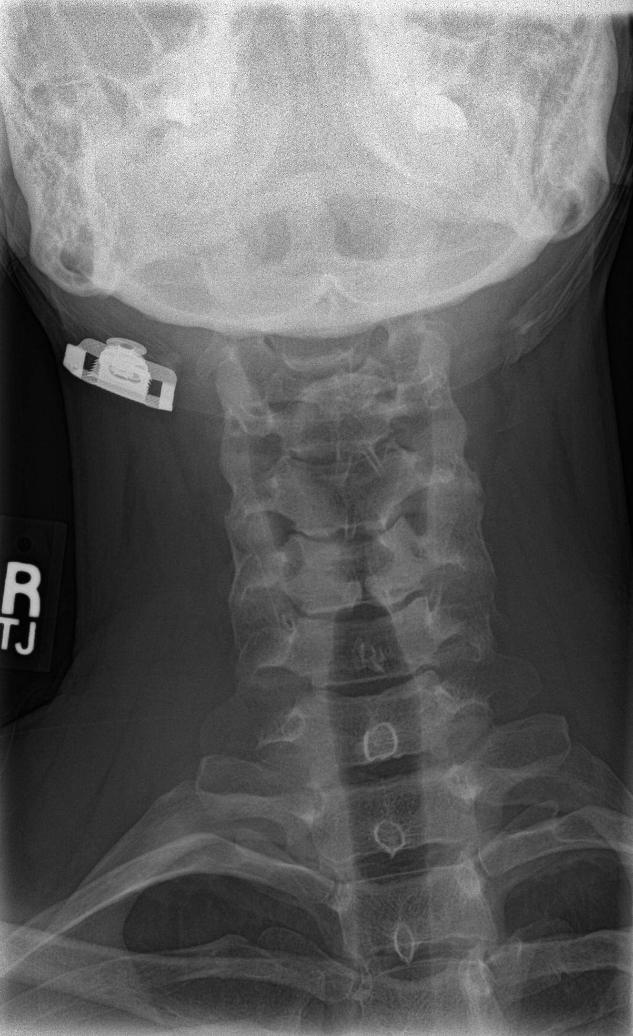

[c-spine open mouth]
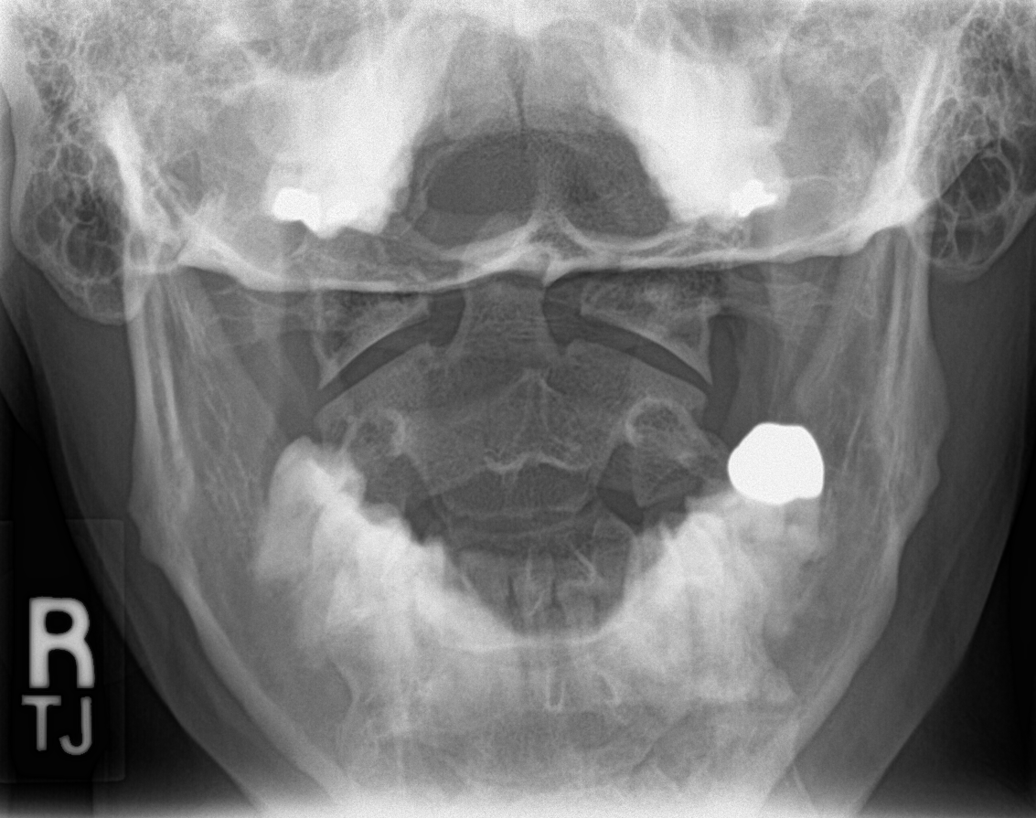

[c-spine swimmers]
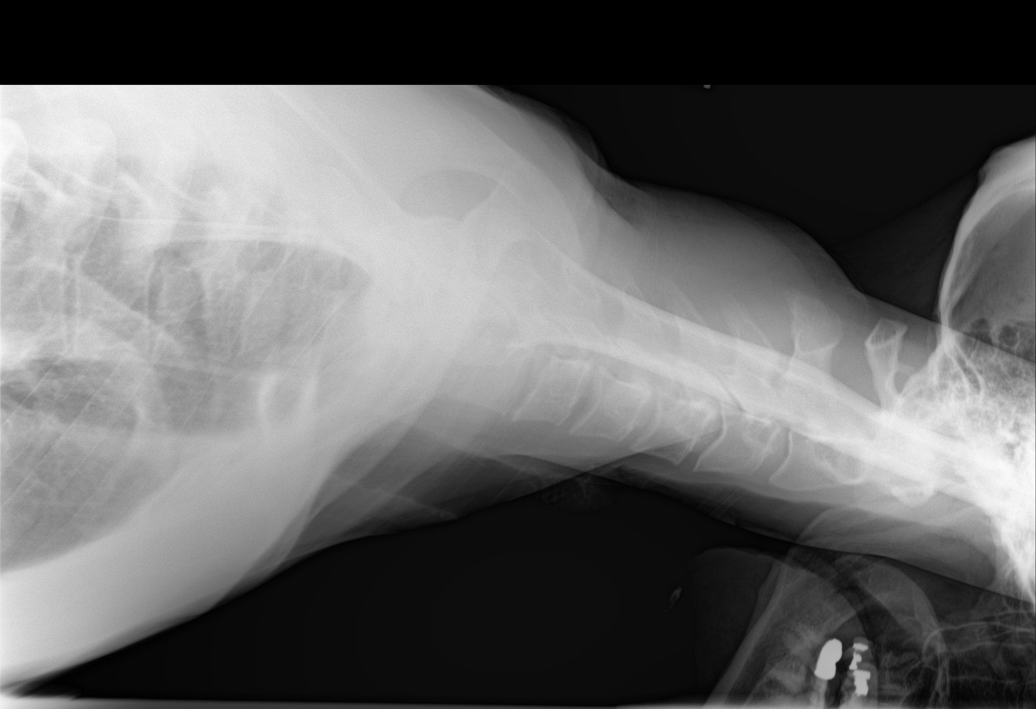

[6 of 6 positions shown; findings below may reference images not displayed]

FINDINGS: All 7 cervical vertebrae are well visualized. No acute fracture or
traumatic listhesis is evident. The dens is intact. Lateral masses
of C1 are normally apposed to C2. Intervertebral disc spaces are
largely maintained. Mild uncinate spurring is present C5-6 and C6-7
which results and mild left neural foraminal narrowing at these
levels. No other significant canal stenosis or foraminal narrowing.
No acute abnormality in the upper chest or imaged lung apices.
IMPRESSION: Mild degenerative changes at C5-6 and C6-7 resulting mild left
neural foraminal narrowing at these levels.

## 2021-08-05 ENCOUNTER — Encounter: Payer: 59 | Admitting: Family Medicine

## 2021-08-07 ENCOUNTER — Ambulatory Visit (INDEPENDENT_AMBULATORY_CARE_PROVIDER_SITE_OTHER): Payer: No Typology Code available for payment source | Admitting: Family Medicine

## 2021-08-07 ENCOUNTER — Encounter: Payer: Self-pay | Admitting: Family Medicine

## 2021-08-07 VITALS — BP 104/78 | HR 53 | Temp 98.8°F | Ht 73.75 in | Wt 192.1 lb

## 2021-08-07 DIAGNOSIS — Z23 Encounter for immunization: Secondary | ICD-10-CM

## 2021-08-07 DIAGNOSIS — Z114 Encounter for screening for human immunodeficiency virus [HIV]: Secondary | ICD-10-CM | POA: Diagnosis not present

## 2021-08-07 DIAGNOSIS — R5383 Other fatigue: Secondary | ICD-10-CM

## 2021-08-07 DIAGNOSIS — Z1159 Encounter for screening for other viral diseases: Secondary | ICD-10-CM

## 2021-08-07 DIAGNOSIS — Z131 Encounter for screening for diabetes mellitus: Secondary | ICD-10-CM

## 2021-08-07 DIAGNOSIS — Z0000 Encounter for general adult medical examination without abnormal findings: Secondary | ICD-10-CM | POA: Diagnosis not present

## 2021-08-07 DIAGNOSIS — Z13220 Encounter for screening for lipoid disorders: Secondary | ICD-10-CM

## 2021-08-07 NOTE — Progress Notes (Signed)
Roben Schliep T. Jveon Pound, MD, CAQ Sports Medicine [facility] [address] [place], [id number]  Phone: [phone]  FAX: [phone]  Robert Mustardaron T Mault - 36 y.o. male  MRN [id number]  Date of Birth: 06/12/1985  Date: 08/07/2021  PCP: Hannah Beatopland, Leily Capek, MD  Referral: Hannah Beatopland, Seba Madole, MD  Chief Complaint  Patient presents with   Annual Exam   Patient Care Team: Hannah Beatopland, Mateya Torti, MD as PCP - General (Family Medicine) Subjective:   Robert Charles is a 36 y.o. pleasant patient who presents with the following:  Preventative Health Maintenance Visit:  Health Maintenance Summary Reviewed and updated, unless pt declines services.  Tobacco History Reviewed. 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   FH of coronary disease -   8016 month old.  - trying for a second.   Has had all covid vaccines - annual flu shot  8 AM -   Wt Readings from Last 3 Encounters:  08/07/21 192 lb 2 oz (87.1 kg)  08/25/19 184 lb (83.5 kg)  07/13/19 184 lb 8 oz (83.7 kg)     Health Maintenance  Topic Date Due   Hepatitis C Screening  Never done   INFLUENZA VACCINE  09/24/2021   TETANUS/TDAP  08/08/2031   HIV Screening  Completed   HPV VACCINES  Aged Out   Immunization History  Administered Date(s) Administered   Influenza Split 12/31/2011   Influenza-Unspecified 11/24/2018, 11/25/2019   Tdap 08/30/2003, 08/07/2021   Patient Active Problem List   Diagnosis Date Noted   HNP (herniated nucleus pulposus), cervical 12/02/2018    Past Medical History:  Diagnosis Date   Bradycardia    Resting HR in 30s at times    Past Surgical History:  Procedure Laterality Date   ANKLE SURGERY Right reconstruction   2005   POSTERIOR CERVICAL LAMINECTOMY Left 12/02/2018   Procedure: Left Cervical  Six-Cervical Seven  cervical laminotomy, foraminotomy, and microdiscectomy;  Surgeon: Shirlean KellyNudelman, Robert, MD;  [facility];  Service: Neurosurgery;  Laterality: Left;  Left Cervical Six-Cervical Seven  cervical laminotomy, foraminotomy, and microdiscectomy   wisdom tooth removal      Family History  Problem Relation Age of Onset   Heart disease Father     Social History   Social History Narrative   Loss adjuster, charteredBusiness Manager, [employer]   Married with 1 child   Enjoys golf and plays in a baseball league.    Past Medical History, Surgical History, Social History, Family History, Problem List, Medications, and Allergies have been reviewed and updated if relevant.  Review of Systems: Pertinent positives are listed above.  Otherwise, a full 14 point review of systems has been done in full and it is negative except where it is noted positive.  Objective:   BP 104/78   Pulse (!) 53   Temp 98.8 F (37.1 C) (Oral)   Ht 6' 1.75" (1.873 m)   Wt 192 lb 2 oz (87.1 kg)   SpO2 96%   BMI 24.83 kg/m  Ideal Body Weight: Weight in (lb) to have BMI = 25: 193  Ideal Body Weight: Weight in (lb) to have BMI = 25: 193 No results found.    08/07/2021   10:26 AM 11/15/2014    3:51 PM  Depression screen PHQ 2/9  Decreased Interest 0 0  Down, Depressed, Hopeless 0 0  PHQ - 2 Score 0 0     GEN: well developed, well nourished, no acute distress Eyes: conjunctiva and lids normal, PERRLA, EOMI ENT: TM clear, nares clear, oral exam WNL Neck: supple, no lymphadenopathy, no thyromegaly, no JVD Pulm: clear to auscultation and percussion, respiratory effort normal CV: regular rate and rhythm, S1-S2, no murmur, rub or gallop, no bruits, peripheral pulses normal and symmetric, no cyanosis, clubbing, edema or varicosities GI: soft, non-tender; no hepatosplenomegaly, masses; active bowel sounds all quadrants GU: deferred Lymph: no cervical, axillary or inguinal adenopathy MSK: gait normal, muscle tone and  strength WNL, no joint swelling, effusions, discoloration, crepitus  SKIN: clear, good turgor, color WNL, no rashes, lesions, or ulcerations Neuro: normal mental status, normal strength, sensation, and motion Psych: alert; oriented to person, place and time, normally interactive and not anxious or depressed in appearance.  All labs reviewed with patient.   Assessment and Plan:     ICD-10-CM   1. Healthcare maintenance  Z00.00     2. Need for Tdap vaccination  Z23 Tdap vaccine greater than or equal to 7yo IM    3. Screening for HIV (human immunodeficiency virus)  Z11.4 HIV Antibody (routine testing w rflx)    4. Need for hepatitis C screening test  Z11.59 Hepatitis C antibody    5. Lipid screening  Z13.220     6. Screening, lipid  Z13.220 Lipid panel    7. Screening for diabetes mellitus  Z13.1 Basic metabolic panel    Hemoglobin A1c    8. Other fatigue  R53.83 CBC with Differential/Platelet    Hepatic function panel     Doing well  Check all labs  Tdap today.  All covid vaccines are up to date.  Health Maintenance Exam: The patient's preventative maintenance and recommended screening tests for an annual wellness exam were reviewed in full today. Brought up to date unless services declined.  Counselled on the importance of diet, exercise, and its role in overall health and mortality. The patient's FH and SH was reviewed, including their home life, tobacco status, and drug and alcohol status.  Follow-up in 1 year for physical exam or additional follow-up below.  Follow-up: No follow-ups on file. Or follow-up in 1 year if not noted.  No orders of the defined types were placed in this encounter.  Medications Discontinued During This Encounter  Medication Reason   HYDROcodone-acetaminophen (NORCO/VICODIN) 5-325 MG tablet    cyclobenzaprine (FLEXERIL) 10 MG tablet    Orders Placed This Encounter  Procedures   Tdap vaccine greater than or equal to 7yo IM   Basic  metabolic panel   CBC with Differential/Platelet   Hepatic function panel   Hemoglobin A1c   Lipid panel   HIV Antibody (routine testing w rflx)   Hepatitis C antibody    Signed,  Tiasia Weberg T. Ashwika Freels, MD   Allergies as of 08/07/2021   No Known Allergies      Medication List        Accurate as of August 07, 2021  3:34 PM. If you have any questions, ask your nurse or doctor.          STOP taking these medications    cyclobenzaprine 10 MG tablet Commonly known as: FLEXERIL Stopped by: Hannah Beat, MD   HYDROcodone-acetaminophen 5-325 MG tablet Commonly known as: NORCO/VICODIN Stopped by: Hannah Beat, MD       TAKE these medications    ibuprofen 200 MG tablet Commonly  known as: ADVIL Take 400 mg by mouth every 6 (six) hours as needed for headache or moderate pain.

## 2021-08-08 ENCOUNTER — Other Ambulatory Visit (INDEPENDENT_AMBULATORY_CARE_PROVIDER_SITE_OTHER): Payer: No Typology Code available for payment source

## 2021-08-08 DIAGNOSIS — Z131 Encounter for screening for diabetes mellitus: Secondary | ICD-10-CM

## 2021-08-08 DIAGNOSIS — Z13220 Encounter for screening for lipoid disorders: Secondary | ICD-10-CM | POA: Diagnosis not present

## 2021-08-08 DIAGNOSIS — R5383 Other fatigue: Secondary | ICD-10-CM

## 2021-08-08 DIAGNOSIS — Z1159 Encounter for screening for other viral diseases: Secondary | ICD-10-CM | POA: Diagnosis not present

## 2021-08-08 DIAGNOSIS — Z114 Encounter for screening for human immunodeficiency virus [HIV]: Secondary | ICD-10-CM

## 2021-08-08 LAB — CBC WITH DIFFERENTIAL/PLATELET
Basophils Absolute: 0 10*3/uL (ref 0.0–0.1)
Basophils Relative: 0.4 % (ref 0.0–3.0)
Eosinophils Absolute: 0.1 10*3/uL (ref 0.0–0.7)
Eosinophils Relative: 1.3 % (ref 0.0–5.0)
HCT: 43.8 % (ref 39.0–52.0)
Hemoglobin: 14.8 g/dL (ref 13.0–17.0)
Lymphocytes Relative: 28.5 % (ref 12.0–46.0)
Lymphs Abs: 1.7 10*3/uL (ref 0.7–4.0)
MCHC: 33.7 g/dL (ref 30.0–36.0)
MCV: 89.7 fl (ref 78.0–100.0)
Monocytes Absolute: 0.5 10*3/uL (ref 0.1–1.0)
Monocytes Relative: 8.5 % (ref 3.0–12.0)
Neutro Abs: 3.7 10*3/uL (ref 1.4–7.7)
Neutrophils Relative %: 61.3 % (ref 43.0–77.0)
Platelets: 206 10*3/uL (ref 150.0–400.0)
RBC: 4.88 Mil/uL (ref 4.22–5.81)
RDW: 12.6 % (ref 11.5–15.5)
WBC: 6.1 10*3/uL (ref 4.0–10.5)

## 2021-08-08 LAB — HEPATIC FUNCTION PANEL
ALT: 15 U/L (ref 0–53)
AST: 18 U/L (ref 0–37)
Albumin: 4.6 g/dL (ref 3.5–5.2)
Alkaline Phosphatase: 34 U/L — ABNORMAL LOW (ref 39–117)
Bilirubin, Direct: 0.1 mg/dL (ref 0.0–0.3)
Total Bilirubin: 0.5 mg/dL (ref 0.2–1.2)
Total Protein: 6.9 g/dL (ref 6.0–8.3)

## 2021-08-08 LAB — BASIC METABOLIC PANEL
BUN: 12 mg/dL (ref 6–23)
CO2: 30 mEq/L (ref 19–32)
Calcium: 9.9 mg/dL (ref 8.4–10.5)
Chloride: 104 mEq/L (ref 96–112)
Creatinine, Ser: 1.16 mg/dL (ref 0.40–1.50)
GFR: 81.21 mL/min (ref >60.00)
Glucose, Bld: 93 mg/dL (ref 70–99)
Potassium: 4.1 mEq/L (ref 3.5–5.1)
Sodium: 140 mEq/L (ref 135–145)

## 2021-08-08 LAB — LIPID PANEL
Cholesterol: 238 mg/dL — ABNORMAL HIGH (ref 0–200)
HDL: 39.5 mg/dL (ref >39.00)
NonHDL: 198.45
Total CHOL/HDL Ratio: 6
Triglycerides: 328 mg/dL — ABNORMAL HIGH (ref 0.0–149.0)
VLDL: 65.6 mg/dL — ABNORMAL HIGH (ref 0.0–40.0)

## 2021-08-08 LAB — HEMOGLOBIN A1C: Hgb A1c MFr Bld: 5.5 % (ref 4.6–6.5)

## 2021-08-08 LAB — LDL CHOLESTEROL, DIRECT: Direct LDL: 135 mg/dL

## 2021-08-09 LAB — HIV ANTIBODY (ROUTINE TESTING W REFLEX): HIV 1&2 Ab, 4th Generation: NONREACTIVE

## 2021-08-09 LAB — HEPATITIS C ANTIBODY
Hepatitis C Ab: NONREACTIVE
SIGNAL TO CUT-OFF: 0.03 (ref <1.00)

## 2021-12-01 ENCOUNTER — Encounter: Payer: Self-pay | Admitting: Family Medicine

## 2021-12-01 DIAGNOSIS — Z91013 Allergy to seafood: Secondary | ICD-10-CM

## 2021-12-02 ENCOUNTER — Other Ambulatory Visit (HOSPITAL_COMMUNITY): Payer: Self-pay

## 2021-12-02 MED ORDER — EPINEPHRINE 0.3 MG/0.3ML IJ SOAJ
0.3000 mg | INTRAMUSCULAR | 3 refills | Status: DC | PRN
  Filled 2021-12-02: qty 2, 2d supply, fill #0

## 2021-12-12 ENCOUNTER — Other Ambulatory Visit (HOSPITAL_COMMUNITY): Payer: Self-pay

## 2021-12-24 ENCOUNTER — Encounter: Payer: Self-pay | Admitting: Internal Medicine

## 2021-12-24 ENCOUNTER — Ambulatory Visit (INDEPENDENT_AMBULATORY_CARE_PROVIDER_SITE_OTHER): Payer: No Typology Code available for payment source | Admitting: Internal Medicine

## 2021-12-24 VITALS — BP 130/84 | HR 65 | Temp 98.2°F | Resp 16 | Ht 73.43 in | Wt 192.2 lb

## 2021-12-24 DIAGNOSIS — T7802XA Anaphylactic reaction due to shellfish (crustaceans), initial encounter: Secondary | ICD-10-CM | POA: Diagnosis not present

## 2021-12-24 NOTE — Progress Notes (Signed)
NEW PATIENT  Date of Service/Encounter:  12/24/21  Consult requested by: Hannah Beat, MD   Subjective:   Robert Charles (DOB: [birth year]) is a 36 y.o. male who presents to the clinic on 12/24/2021 with a chief complaint of Allergy Testing .    History obtained from: chart review and patient.   Concern for Food Allergy:  Foods of concern: Shrimp  History of reaction:  He eats shellfish frequently for a healthier diet.   Beginning of this month, he ate fried popcorn shrimp and felt tongue tingling. Following week, ate popcorn shrimp from the same place and felt tongue swelling and funny.  Took benadryl and felt fine.  He did not have to go to the ER. Had okra and hushpuppies at the time also and still eats those without issues. No new medications or illness at the time.  He sometimes does take Ibuprofen.  He takes omega 3 and fiber but no other OTC.  Avoiding all shellfish since then. Avoiding fish also.   Previous allergy testing no  Carries an epinephrine autoinjector:  prescribed but has not picked it up    Past Medical History: Past Medical History:  Diagnosis Date   Bradycardia    Resting HR in 30s at times    Past Surgical History: Past Surgical History:  Procedure Laterality Date   ANKLE SURGERY Right reconstruction   2005   POSTERIOR CERVICAL LAMINECTOMY Left 12/02/2018   Procedure: Left Cervical Six-Cervical Seven  cervical laminotomy, foraminotomy, and microdiscectomy;  Surgeon: Shirlean Kelly, MD;  Location: [facility];  Service: Neurosurgery;  Laterality: Left;  Left Cervical Six-Cervical Seven  cervical laminotomy, foraminotomy, and microdiscectomy   wisdom tooth removal      Family History: Family History  Problem Relation Age of Onset   Heart disease Father     Social History:  Lives in a 1955 year house Flooring in bedroom: carpet Pets: dog Tobacco use/exposure: none Job: Loss adjuster, chartered, works in cath lab PRN  Medication List:  Allergies  as of 12/24/2021   No Known Allergies      Medication List        Accurate as of December 24, 2021 11:31 AM. If you have any questions, ask your nurse or doctor.          EPINEPHrine 0.3 mg/0.3 mL Soaj injection Commonly known as: EPI-PEN Inject 0.3 mg into the muscle as needed for anaphylaxis.   ibuprofen 200 MG tablet Commonly known as: ADVIL Take 400 mg by mouth every 6 (six) hours as needed for headache or moderate pain.         REVIEW OF SYSTEMS: Pertinent positives and negatives discussed in HPI.   Objective:   Physical Exam: BP 130/84   Pulse 65   Temp 98.2 F (36.8 C) (Temporal)   Resp 16   Ht 6' 1.43" (1.865 m)   Wt 192 lb 3.2 oz (87.2 kg)   SpO2 97%   BMI 25.07 kg/m  Body mass index is 25.07 kg/m. GEN: alert, well developed HEENT: clear conjunctiva, moist mucosa HEART: regular rate and rhythm, no murmur LUNGS: clear to auscultation bilaterally, no coughing, unlabored respiration ABDOMEN: soft, non distended  SKIN: no rashes or lesions  Reviewed:  PCP discussion 11/2021: had tongue tingling with shellfish and was told to avoid all shellfish and prescribed Epipen by Dr. Patsy Lager.  Skin Testing:  Skin prick testing was placed, which includes aeroallergens/foods, histamine control, and saline control.  Verbal consent was obtained prior to placing  test.  We discussed risks including anaphylaxis. Patient tolerated procedure well.  Allergy testing results were read and interpreted by myself, documented by clinical staff. Adequate positive and negative control.  Results discussed with patient/family.  Food Adult Perc - 12/24/21 1000     Time Antigen Placed 1028    Allergen Manufacturer Lavella Hammock    Location Back    Number of allergen test 8     Control-buffer 50% Glycerol Negative    Control-Histamine 1 mg/ml 3+    8. Shellfish Mix Negative    25. Shrimp Negative    26. Crab Negative    27. Lobster Negative    28. Oyster Negative    29. Scallops  Negative               Assessment:   1. Anaphylactic shock due to shellfish, initial encounter     Plan/Recommendations:   Food allergy:  - Odd case, adult development of shellfish allergy is possible but rare and usually in people who avoid it for prolonged periods while he has been eating it frequently.  Unsure maybe if this was a reaction to an additive rather than the shrimp itself.  SPT negative today.  Will obtain sIgE shellfish.  If both are negative, can consider in office or home challenge with boiled shrimp, no additives/seasoning.  No need to avoid fish, can resume eating- he has not had a reaction to this.  - please strictly avoid shellfish - for SKIN only reaction, okay to take Benadryl 25mg  capsules  - for SKIN + ANY additional symptoms, OR IF concern for LIFE THREATENING reaction = Epipen Autoinjector EpiPen 0.3 mg. - If using Epinephrine autoinjector, call 911 or go to the ER.    Return in about 6 months (around 06/24/2022).       Return in about 6 months (around 06/24/2022).  Harlon Flor, MD [facility]

## 2021-12-24 NOTE — Patient Instructions (Addendum)
Food allergy:  - please strictly avoid shellfish - for SKIN only reaction, okay to take Benadryl 25mg  capsules  - for SKIN + ANY additional symptoms, OR IF concern for LIFE THREATENING reaction = Epipen Autoinjector EpiPen 0.3 mg. - If using Epinephrine autoinjector, call 911 or go to the ER.    Return in about 6 months (around 06/24/2022).

## 2021-12-27 LAB — ALLERGEN PROFILE, SHELLFISH
Clam IgE: <0.1 kU/L
F023-IgE Crab: <0.1 kU/L
F080-IgE Lobster: <0.1 kU/L
F290-IgE Oyster: <0.1 kU/L
Scallop IgE: <0.1 kU/L
Shrimp IgE: <0.1 kU/L

## 2022-08-04 ENCOUNTER — Other Ambulatory Visit: Payer: Self-pay | Admitting: Family Medicine

## 2022-08-04 DIAGNOSIS — R5383 Other fatigue: Secondary | ICD-10-CM

## 2022-08-04 DIAGNOSIS — Z131 Encounter for screening for diabetes mellitus: Secondary | ICD-10-CM

## 2022-08-04 DIAGNOSIS — Z13220 Encounter for screening for lipoid disorders: Secondary | ICD-10-CM

## 2022-08-06 ENCOUNTER — Other Ambulatory Visit: Payer: 59

## 2022-08-06 DIAGNOSIS — Z131 Encounter for screening for diabetes mellitus: Secondary | ICD-10-CM | POA: Diagnosis not present

## 2022-08-06 DIAGNOSIS — R5383 Other fatigue: Secondary | ICD-10-CM

## 2022-08-06 DIAGNOSIS — Z13220 Encounter for screening for lipoid disorders: Secondary | ICD-10-CM

## 2022-08-06 NOTE — Addendum Note (Signed)
Addended by: Trellis Paganini D on: 08/06/2022 08:16 AM   Modules accepted: Orders

## 2022-08-07 LAB — BASIC METABOLIC PANEL
BUN/Creatinine Ratio: 12 (ref 9–20)
BUN: 15 mg/dL (ref 6–20)
CO2: 25 mmol/L (ref 20–29)
Calcium: 10.5 mg/dL — ABNORMAL HIGH (ref 8.7–10.2)
Chloride: 102 mmol/L (ref 96–106)
Creatinine, Ser: 1.21 mg/dL (ref 0.76–1.27)
Glucose: 96 mg/dL (ref 70–99)
Potassium: 4.2 mmol/L (ref 3.5–5.2)
Sodium: 141 mmol/L (ref 134–144)
eGFR: 79 mL/min/{1.73_m2} (ref >59)

## 2022-08-07 LAB — HEPATIC FUNCTION PANEL
ALT: 23 IU/L (ref 0–44)
AST: 21 IU/L (ref 0–40)
Albumin: 5.1 g/dL (ref 4.1–5.1)
Alkaline Phosphatase: 41 IU/L — ABNORMAL LOW (ref 44–121)
Bilirubin Total: 0.8 mg/dL (ref 0.0–1.2)
Bilirubin, Direct: 0.14 mg/dL (ref 0.00–0.40)
Total Protein: 7.5 g/dL (ref 6.0–8.5)

## 2022-08-07 LAB — CBC WITH DIFFERENTIAL/PLATELET
Basophils Absolute: 0 10*3/uL (ref 0.0–0.2)
Basos: 1 %
EOS (ABSOLUTE): 0.1 10*3/uL (ref 0.0–0.4)
Eos: 1 %
Hematocrit: 47.7 % (ref 37.5–51.0)
Hemoglobin: 15.9 g/dL (ref 13.0–17.7)
Immature Grans (Abs): 0 10*3/uL (ref 0.0–0.1)
Immature Granulocytes: 0 %
Lymphocytes Absolute: 2.7 10*3/uL (ref 0.7–3.1)
Lymphs: 46 %
MCH: 29.9 pg (ref 26.6–33.0)
MCHC: 33.3 g/dL (ref 31.5–35.7)
MCV: 90 fL (ref 79–97)
Monocytes Absolute: 0.5 10*3/uL (ref 0.1–0.9)
Monocytes: 8 %
Neutrophils Absolute: 2.6 10*3/uL (ref 1.4–7.0)
Neutrophils: 44 %
Platelets: 233 10*3/uL (ref 150–450)
RBC: 5.32 x10E6/uL (ref 4.14–5.80)
RDW: 12.2 % (ref 11.6–15.4)
WBC: 5.9 10*3/uL (ref 3.4–10.8)

## 2022-08-07 LAB — LIPID PANEL
Chol/HDL Ratio: 5.7 ratio — ABNORMAL HIGH (ref 0.0–5.0)
Cholesterol, Total: 255 mg/dL — ABNORMAL HIGH (ref 100–199)
HDL: 45 mg/dL (ref >39)
LDL Chol Calc (NIH): 174 mg/dL — ABNORMAL HIGH (ref 0–99)
Triglycerides: 195 mg/dL — ABNORMAL HIGH (ref 0–149)
VLDL Cholesterol Cal: 36 mg/dL (ref 5–40)

## 2022-08-07 LAB — HEMOGLOBIN A1C
Est. average glucose Bld gHb Est-mCnc: 114 mg/dL
Hgb A1c MFr Bld: 5.6 % (ref 4.8–5.6)

## 2022-08-08 LAB — LIPOPROTEIN A (LPA): Lipoprotein (a): <8.4 nmol/L (ref <75.0)

## 2022-08-12 NOTE — Progress Notes (Unsigned)
Caterine Mcmeans T. Shakemia Madera, MD, CAQ Sports Medicine [facility] at [facility] [address] [place], [address]  Phone: [phone]  FAX: [phone]  Robert Charles - 37 y.o. male  MRN [id number]  Date of Birth: August 15, 1985  Date: 08/13/2022  PCP: Hannah Beat, MD  Referral: Hannah Beat, MD  No chief complaint on file.  Patient Care Team: Hannah Beat, MD as PCP - General (Family Medicine) Subjective:   Robert Charles is a 37 y.o. pleasant patient who presents with the following:  Preventative Health Maintenance Visit:  Health Maintenance Summary Reviewed and updated, unless pt declines services.  Tobacco History Reviewed. Alcohol: No concerns, no excessive use Exercise Habits: Some activity, rec at least 30 mins 5 times a week STD concerns: no risk or activity to increase risk Drug Use: None  Covid O/w up to date  Health Maintenance  Topic Date Due   COVID-19 Vaccine (1) Never done   INFLUENZA VACCINE  09/25/2022   DTaP/Tdap/Td (3 - Td or Tdap) 08/08/2031   Hepatitis C Screening  Completed   HIV Screening  Completed   HPV VACCINES  Aged Out   Immunization History  Administered Date(s) Administered   Influenza Split 12/31/2011   Influenza-Unspecified 11/24/2018, 11/25/2019   Tdap 08/30/2003, 08/07/2021   Patient Active Problem List   Diagnosis Date Noted   HNP (herniated nucleus pulposus), cervical 12/02/2018    Past Medical History:  Diagnosis Date   Bradycardia    Resting HR in 30s at times    Past Surgical History:  Procedure Laterality Date   ANKLE SURGERY Right reconstruction   2005   POSTERIOR CERVICAL LAMINECTOMY Left 12/02/2018   Procedure: Left Cervical Six-Cervical Seven  cervical laminotomy, foraminotomy, and microdiscectomy;  Surgeon: Shirlean Kelly, MD;  Location: [facility] OR;  Service: Neurosurgery;  Laterality: Left;  Left Cervical Six-Cervical Seven  cervical laminotomy, foraminotomy, and microdiscectomy    wisdom tooth removal      Family History  Problem Relation Age of Onset   Heart disease Father     Social History   Social History Narrative   Loss adjuster, chartered, [employer]   Married with 1 child   Enjoys golf and plays in a baseball league.    Past Medical History, Surgical History, Social History, Family History, Problem List, Medications, and Allergies have been reviewed and updated if relevant.  Review of Systems: Pertinent positives are listed above.  Otherwise, a full 14 point review of systems has been done in full and it is negative except where it is noted positive.  Objective:   There were no vitals taken for this visit. Ideal Body Weight:    Ideal Body Weight:   No results found.    08/07/2021   10:26 AM 11/15/2014    3:51 PM  Depression screen PHQ 2/9  Decreased Interest 0 0  Down, Depressed, Hopeless 0 0  PHQ - 2 Score 0 0     GEN: well developed, well nourished, no acute distress Eyes: conjunctiva and lids normal, PERRLA, EOMI ENT: TM clear, nares clear, oral exam WNL Neck: supple, no lymphadenopathy, no thyromegaly, no JVD Pulm: clear to auscultation and percussion, respiratory effort normal CV: regular rate and rhythm, S1-S2, no murmur, rub or gallop, no bruits, peripheral pulses normal and symmetric, no cyanosis, clubbing, edema or varicosities GI: soft, non-tender; no hepatosplenomegaly, masses; active bowel sounds all quadrants GU: deferred Lymph: no cervical, axillary or inguinal adenopathy MSK: gait normal, muscle tone and strength  WNL, no joint swelling, effusions, discoloration, crepitus  SKIN: clear, good turgor, color WNL, no rashes, lesions, or ulcerations Neuro: normal mental status, normal strength, sensation, and motion Psych: alert; oriented to person, place and time, normally interactive and not anxious or depressed in appearance.  All labs reviewed with patient. Results for orders placed or performed in visit on 08/06/22  Basic  metabolic panel  Result Value Ref Range   Glucose 96 70 - 99 mg/dL   BUN 15 6 - 20 mg/dL   Creatinine, Ser 1.61 0.76 - 1.27 mg/dL   eGFR 79 >09 UE/AVW/0.98   BUN/Creatinine Ratio 12 9 - 20   Sodium 141 134 - 144 mmol/L   Potassium 4.2 3.5 - 5.2 mmol/L   Chloride 102 96 - 106 mmol/L   CO2 25 20 - 29 mmol/L   Calcium 10.5 (H) 8.7 - 10.2 mg/dL  Hemoglobin J1B  Result Value Ref Range   Hgb A1c MFr Bld 5.6 4.8 - 5.6 %   Est. average glucose Bld gHb Est-mCnc 114 mg/dL  Hepatic function panel  Result Value Ref Range   Total Protein 7.5 6.0 - 8.5 g/dL   Albumin 5.1 4.1 - 5.1 g/dL   Bilirubin Total 0.8 0.0 - 1.2 mg/dL   Bilirubin, Direct 1.47 0.00 - 0.40 mg/dL   Alkaline Phosphatase 41 (L) 44 - 121 IU/L   AST 21 0 - 40 IU/L   ALT 23 0 - 44 IU/L  CBC with Differential/Platelet  Result Value Ref Range   WBC 5.9 3.4 - 10.8 x10E3/uL   RBC 5.32 4.14 - 5.80 x10E6/uL   Hemoglobin 15.9 13.0 - 17.7 g/dL   Hematocrit 82.9 56.2 - 51.0 %   MCV 90 79 - 97 fL   MCH 29.9 26.6 - 33.0 pg   MCHC 33.3 31.5 - 35.7 g/dL   RDW 13.0 86.5 - 78.4 %   Platelets 233 150 - 450 x10E3/uL   Neutrophils 44 Not Estab. %   Lymphs 46 Not Estab. %   Monocytes 8 Not Estab. %   Eos 1 Not Estab. %   Basos 1 Not Estab. %   Neutrophils Absolute 2.6 1.4 - 7.0 x10E3/uL   Lymphocytes Absolute 2.7 0.7 - 3.1 x10E3/uL   Monocytes Absolute 0.5 0.1 - 0.9 x10E3/uL   EOS (ABSOLUTE) 0.1 0.0 - 0.4 x10E3/uL   Basophils Absolute 0.0 0.0 - 0.2 x10E3/uL   Immature Granulocytes 0 Not Estab. %   Immature Grans (Abs) 0.0 0.0 - 0.1 x10E3/uL  Lipid panel  Result Value Ref Range   Cholesterol, Total 255 (H) 100 - 199 mg/dL   Triglycerides 696 (H) 0 - 149 mg/dL   HDL 45 >29 mg/dL   VLDL Cholesterol Cal 36 5 - 40 mg/dL   LDL Chol Calc (NIH) 528 (H) 0 - 99 mg/dL   Chol/HDL Ratio 5.7 (H) 0.0 - 5.0 ratio  Lipoprotein A (LPA)  Result Value Ref Range   Lipoprotein (a) <8.4 <75.0 nmol/L    Assessment and Plan:     ICD-10-CM   1.  Healthcare maintenance  Z00.00       Health Maintenance Exam: The patient's preventative maintenance and recommended screening tests for an annual wellness exam were reviewed in full today. Brought up to date unless services declined.  Counselled on the importance of diet, exercise, and its role in overall health and mortality. The patient's FH and SH was reviewed, including their home life, tobacco status, and drug and alcohol status.  Follow-up in  1 year for physical exam or additional follow-up below.  Disposition: No follow-ups on file.  No orders of the defined types were placed in this encounter.  There are no discontinued medications. No orders of the defined types were placed in this encounter.   Signed,  Elpidio Galea. Tadeusz Stahl, MD   Allergies as of 08/13/2022   No Known Allergies      Medication List        Accurate as of August 12, 2022  2:01 PM. If you have any questions, ask your nurse or doctor.          EPINEPHrine 0.3 mg/0.3 mL Soaj injection Commonly known as: EPI-PEN Inject 0.3 mg into the muscle as needed for anaphylaxis.   ibuprofen 200 MG tablet Commonly known as: ADVIL Take 400 mg by mouth every 6 (six) hours as needed for headache or moderate pain.

## 2022-08-13 ENCOUNTER — Encounter: Payer: Self-pay | Admitting: Family Medicine

## 2022-08-13 ENCOUNTER — Other Ambulatory Visit (HOSPITAL_COMMUNITY): Payer: Self-pay

## 2022-08-13 ENCOUNTER — Ambulatory Visit (INDEPENDENT_AMBULATORY_CARE_PROVIDER_SITE_OTHER): Payer: 59 | Admitting: Family Medicine

## 2022-08-13 VITALS — BP 110/70 | HR 61 | Temp 98.1°F | Ht 73.75 in | Wt 191.2 lb

## 2022-08-13 DIAGNOSIS — E782 Mixed hyperlipidemia: Secondary | ICD-10-CM | POA: Diagnosis not present

## 2022-08-13 DIAGNOSIS — Z8249 Family history of ischemic heart disease and other diseases of the circulatory system: Secondary | ICD-10-CM | POA: Insufficient documentation

## 2022-08-13 DIAGNOSIS — I8392 Asymptomatic varicose veins of left lower extremity: Secondary | ICD-10-CM | POA: Diagnosis not present

## 2022-08-13 DIAGNOSIS — G4700 Insomnia, unspecified: Secondary | ICD-10-CM | POA: Insufficient documentation

## 2022-08-13 DIAGNOSIS — Z79899 Other long term (current) drug therapy: Secondary | ICD-10-CM

## 2022-08-13 DIAGNOSIS — Z0000 Encounter for general adult medical examination without abnormal findings: Secondary | ICD-10-CM

## 2022-08-13 HISTORY — DX: Mixed hyperlipidemia: E78.2

## 2022-08-13 MED ORDER — ROSUVASTATIN CALCIUM 10 MG PO TABS
10.0000 mg | ORAL_TABLET | Freq: Every day | ORAL | 3 refills | Status: AC
  Filled 2022-08-13: qty 90, 90d supply, fill #0
  Filled 2023-01-20: qty 90, 90d supply, fill #1

## 2022-08-13 NOTE — Addendum Note (Signed)
Addended by: Merrianne Mccumbers Y on: 08/13/2022 02:20 PM   Modules accepted: Orders  

## 2022-08-13 NOTE — Addendum Note (Signed)
Addended by: Donnamarie Poag on: 08/13/2022 01:56 PM   Modules accepted: Orders

## 2022-08-13 NOTE — Addendum Note (Signed)
Addended by: Donnamarie Poag on: 08/13/2022 02:20 PM   Modules accepted: Orders

## 2022-09-18 ENCOUNTER — Other Ambulatory Visit: Payer: Self-pay | Admitting: *Deleted

## 2022-09-18 DIAGNOSIS — I8393 Asymptomatic varicose veins of bilateral lower extremities: Secondary | ICD-10-CM

## 2022-10-01 ENCOUNTER — Other Ambulatory Visit: Payer: Self-pay | Admitting: *Deleted

## 2022-10-01 ENCOUNTER — Ambulatory Visit (HOSPITAL_COMMUNITY)
Admission: RE | Admit: 2022-10-01 | Discharge: 2022-10-01 | Disposition: A | Discharge status: Home / Self Care | Payer: 59 | Source: Ambulatory Visit | Attending: Physician Assistant | Admitting: Physician Assistant

## 2022-10-01 ENCOUNTER — Ambulatory Visit: Payer: 59 | Admitting: Vascular Surgery

## 2022-10-01 ENCOUNTER — Encounter: Payer: Self-pay | Admitting: Vascular Surgery

## 2022-10-01 VITALS — BP 126/88 | HR 74 | Temp 98.4°F | Resp 16 | Ht 74.0 in | Wt 188.0 lb

## 2022-10-01 DIAGNOSIS — I872 Venous insufficiency (chronic) (peripheral): Secondary | ICD-10-CM | POA: Diagnosis not present

## 2022-10-01 DIAGNOSIS — I83812 Varicose veins of left lower extremities with pain: Secondary | ICD-10-CM

## 2022-10-01 DIAGNOSIS — I8393 Asymptomatic varicose veins of bilateral lower extremities: Secondary | ICD-10-CM | POA: Insufficient documentation

## 2022-10-01 NOTE — Progress Notes (Signed)
ASSESSMENT & PLAN   CHRONIC VENOUS INSUFFICIENCY: This patient has painful varicose veins of the left lower extremity and significant superficial venous reflux.  He has CEAP C2 venous disease.  I have encouraged him to avoid prolonged sitting and standing.  We have discussed the importance of exercise specifically walking and water aerobics.  We discussed the importance of daily leg elevation and proper positioning for this.  In addition we have fitted him for thigh-high compression stockings with a gradient of 20 to 30 mmHg.  Fortunately he has a healthy weight.  I will bring him back in 3 months to see if his symptoms have improved.  If not I think he would be an excellent candidate for laser ablation of the left great saphenous vein and stab phlebectomies.  REASON FOR CONSULT:    Varicose veins of the left lower extremity.  The consult is requested by Dr. Karleen Hampshire Copland.   HPI:   Robert Charles is a 37 y.o. male who presents with a long history of varicose veins of the left lower extremity.  Over the last several years these have gradually gotten worse.  He does describe some aching pain and heaviness in the left leg which is aggravated by sitting and standing and relieved with elevation.  His symptoms are worse at the end of the day.  He has not been wearing medical grade compression stockings.  He does note some swelling in his legs at the end of the day also.  He has had no previous history of DVT and no previous venous procedures.  Past Medical History:  Diagnosis Date   Bradycardia    Resting HR in 30s at times    Family History  Problem Relation Age of Onset   Heart disease Father    SOCIAL HISTORY: Social History   Tobacco Use   Smoking status: Never    Passive exposure: Never   Smokeless tobacco: Never  Substance Use Topics   Alcohol use: Yes    Alcohol/week: 10.0 standard drinks of alcohol    Types: 10 Standard drinks or equivalent per week    Comment: moderate     No Known Allergies  Current Outpatient Medications  Medication Sig Dispense Refill   ibuprofen (ADVIL) 200 MG tablet Take 400 mg by mouth every 6 (six) hours as needed for headache or moderate pain.     rosuvastatin (CRESTOR) 10 MG tablet Take 1 tablet (10 mg total) by mouth daily. 90 tablet 3   No current facility-administered medications for this visit.    REVIEW OF SYSTEMS:  [X]  denotes positive finding, [ ]  denotes negative finding Cardiac  Comments:  Chest pain or chest pressure:    Shortness of breath upon exertion:    Short of breath when lying flat:    Irregular heart rhythm:        Vascular    Pain in calf, thigh, or hip brought on by ambulation:    Pain in feet at night that wakes you up from your sleep:     Blood clot in your veins:    Leg swelling:  x       Pulmonary    Oxygen at home:    Productive cough:     Wheezing:         Neurologic    Sudden weakness in arms or legs:     Sudden numbness in arms or legs:     Sudden onset of difficulty speaking or slurred speech:  Temporary loss of vision in one eye:     Problems with dizziness:         Gastrointestinal    Blood in stool:     Vomited blood:         Genitourinary    Burning when urinating:     Blood in urine:        Psychiatric    Major depression:         Hematologic    Bleeding problems:    Problems with blood clotting too easily:        Skin    Rashes or ulcers:        Constitutional    Fever or chills:    -  PHYSICAL EXAM:   Vitals:   10/01/22 1055  BP: 126/88  Pulse: 74  Resp: 16  Temp: 98.4 F (36.9 C)  TempSrc: Temporal  SpO2: 98%  Weight: 188 lb (85.3 kg)  Height: 6\' 2"  (1.88 m)   Body mass index is 24.14 kg/m. GENERAL: The patient is a well-nourished male, in no acute distress. The vital signs are documented above. CARDIAC: There is a regular rate and rhythm.  VASCULAR: I do not detect carotid bruits. He has palpable pedal pulses. He has markedly enlarged  varicose veins in his medial left calf as documented in the photograph below.  I did look at his left great saphenous vein myself with the SonoSite and it is markedly dilated with reflux throughout. PULMONARY: There is good air exchange bilaterally without wheezing or rales. ABDOMEN: Soft and non-tender with normal pitched bowel sounds.  MUSCULOSKELETAL: There are no major deformities. NEUROLOGIC: No focal weakness or paresthesias are detected. SKIN: There are no ulcers or rashes noted. PSYCHIATRIC: The patient has a normal affect.  DATA:    VENOUS DUPLEX: I have independently interpreted his venous duplex scan today.  This was of the left lower extremity only.  He had no evidence of DVT on the left.  He had deep venous reflux involving the common femoral vein and femoral vein.  He had significant superficial venous reflux from the saphenofemoral junction down to the knee.  Diameters of the vein ranged from 7.7-12.7 mm.  The results of the study are summarized in the diagram below.    Waverly [facility]

## 2022-10-08 ENCOUNTER — Other Ambulatory Visit: Payer: Self-pay

## 2022-10-08 DIAGNOSIS — I872 Venous insufficiency (chronic) (peripheral): Secondary | ICD-10-CM

## 2022-10-09 ENCOUNTER — Encounter (HOSPITAL_COMMUNITY): Payer: 59

## 2022-10-29 ENCOUNTER — Other Ambulatory Visit (HOSPITAL_COMMUNITY): Payer: Self-pay

## 2022-10-29 ENCOUNTER — Ambulatory Visit (INDEPENDENT_AMBULATORY_CARE_PROVIDER_SITE_OTHER): Payer: 59 | Admitting: Family Medicine

## 2022-10-29 ENCOUNTER — Ambulatory Visit: Payer: 59 | Admitting: Family Medicine

## 2022-10-29 VITALS — BP 90/60 | HR 66 | Temp 98.2°F | Ht 73.75 in | Wt 179.4 lb

## 2022-10-29 DIAGNOSIS — J029 Acute pharyngitis, unspecified: Secondary | ICD-10-CM | POA: Diagnosis not present

## 2022-10-29 DIAGNOSIS — R051 Acute cough: Secondary | ICD-10-CM

## 2022-10-29 DIAGNOSIS — R591 Generalized enlarged lymph nodes: Secondary | ICD-10-CM

## 2022-10-29 LAB — POCT RAPID STREP A (OFFICE): Rapid Strep A Screen: POSITIVE — AB

## 2022-10-29 LAB — POC COVID19 BINAXNOW: SARS Coronavirus 2 Ag: NEGATIVE

## 2022-10-29 MED ORDER — AZITHROMYCIN 250 MG PO TABS
ORAL_TABLET | ORAL | 0 refills | Status: AC
  Filled 2022-10-29: qty 6, 5d supply, fill #0

## 2022-10-29 MED ORDER — GUAIFENESIN-CODEINE 100-10 MG/5ML PO SYRP
5.0000 mL | ORAL_SOLUTION | Freq: Every evening | ORAL | 0 refills | Status: DC | PRN
Start: 2022-10-29 — End: 2023-10-05
  Filled 2022-10-29: qty 180, 18d supply, fill #0

## 2022-10-29 NOTE — Progress Notes (Signed)
Patient ID: Robert Charles, male    DOB: 1985-11-23, 37 y.o.   MRN: [id number]  This visit was conducted in person.  BP 90/60 (BP Location: Left Arm, Patient Position: Sitting, Cuff Size: Large)   Pulse 66   Temp 98.2 F (36.8 C) (Temporal)   Ht 6' 1.75" (1.873 m)   Wt 179 lb 6 oz (81.4 kg)   SpO2 99%   BMI 23.19 kg/m    CC:  Chief Complaint  Patient presents with   Cough    X 2 1/2 weeks-Negative Covid Test x 3   Sore Throat   Fever    Low grade   Fatigue        Chills        Hoarse   Diarrhea        No appetite   Adenopathy    Subjective:   HPI: Robert Charles is a 37 y.o. male presenting on 10/29/2022 for Cough (X 2 1/2 weeks-Negative Covid Test x 3), Sore Throat, Fever (Low grade), Fatigue (/), Chills (/), Hoarse, Diarrhea (/), No appetite, and Adenopathy   Date of onset: 2 weeks Initial symptoms included  cough.. children just started daycare Symptoms progressed  in last week to DOE,  severe ST, body aches  Fever 100F  No cough is productive, keeping him up at night...  darker yellow, brown  Very fatigued  Hoarse voice  Headache, no face pain, no ear pain. Feels like lymph nodes swollen Decreased appetite and diarrhea.   One small episode of N and vomiting.Marland Kitchen after cough fit.   Having some trouble keeping up with liquids. 30 oz in last  Feels like lymph nodes in groin swollen.. some  pain in right groin no known injury.   Sick contacts:  kids in daycare COVID testing:   yes x 3     he has tried to treat with  robitussin DM, tylenol, afrin, cough drops     No history of chronic lung disease such as asthma or COPD. Non-smoker.      BP Readings from Last 3 Encounters:  10/29/22 90/60  10/01/22 126/88  08/13/22 110/70     Relevant past medical, surgical, family and social history reviewed and updated as indicated. Interim medical history since our last visit reviewed. Allergies and medications reviewed and updated. Outpatient Medications  Prior to Visit  Medication Sig Dispense Refill   ibuprofen (ADVIL) 200 MG tablet Take 400 mg by mouth every 6 (six) hours as needed for headache or moderate pain.     rosuvastatin (CRESTOR) 10 MG tablet Take 1 tablet (10 mg total) by mouth daily. 90 tablet 3   No facility-administered medications prior to visit.     Per HPI unless specifically indicated in ROS section below Review of Systems  Constitutional:  Negative for fatigue and fever.  HENT:  Negative for ear pain.   Eyes:  Negative for pain.  Respiratory:  Negative for cough and shortness of breath.   Cardiovascular:  Negative for chest pain, palpitations and leg swelling.  Gastrointestinal:  Negative for abdominal pain.  Genitourinary:  Negative for dysuria.  Musculoskeletal:  Negative for arthralgias.  Neurological:  Negative for syncope, light-headedness and headaches.  Psychiatric/Behavioral:  Negative for dysphoric mood.    Objective:  BP 90/60 (BP Location: Left Arm, Patient Position: Sitting, Cuff Size: Large)   Pulse 66   Temp 98.2 F (36.8 C) (Temporal)   Ht 6' 1.75" (1.873 m)   Wt 179 lb  6 oz (81.4 kg)   SpO2 99%   BMI 23.19 kg/m   Wt Readings from Last 3 Encounters:  10/29/22 179 lb 6 oz (81.4 kg)  10/01/22 188 lb (85.3 kg)  08/13/22 191 lb 4 oz (86.8 kg)      Physical Exam Constitutional:      General: He is not in acute distress.    Appearance: Normal appearance. He is well-developed. He is ill-appearing. He is not toxic-appearing.  HENT:     Head: Normocephalic and atraumatic.     Right Ear: Hearing, tympanic membrane, ear canal and external ear normal. No tenderness. No foreign body. Tympanic membrane is not retracted or bulging.     Left Ear: Hearing, tympanic membrane, ear canal and external ear normal. No tenderness. No foreign body. Tympanic membrane is not retracted or bulging.     Nose: Nose normal. No mucosal edema or rhinorrhea.     Right Sinus: No maxillary sinus tenderness or frontal sinus  tenderness.     Left Sinus: No maxillary sinus tenderness or frontal sinus tenderness.     Mouth/Throat:     Mouth: Mucous membranes are normal.     Dentition: Normal dentition. No dental caries.     Pharynx: Uvula midline. Posterior oropharyngeal erythema present. No oropharyngeal exudate.     Tonsils: No tonsillar abscesses. 1+ on the right. 1+ on the left.  Eyes:     General: Lids are normal. Lids are everted, no foreign bodies appreciated.     Extraocular Movements: EOM normal.     Conjunctiva/sclera: Conjunctivae normal.     Pupils: Pupils are equal, round, and reactive to light.  Neck:     Thyroid: No thyroid mass or thyromegaly.     Vascular: No carotid bruit.     Trachea: Trachea and phonation normal.  Cardiovascular:     Rate and Rhythm: Normal rate and regular rhythm.     Pulses: Normal pulses.     Heart sounds: Normal heart sounds, S1 normal and S2 normal. No murmur heard.    No gallop.  Pulmonary:     Effort: Pulmonary effort is normal. No respiratory distress.     Breath sounds: Normal breath sounds. No wheezing, rhonchi or rales.  Abdominal:     General: Bowel sounds are normal.     Palpations: Abdomen is soft.     Tenderness: There is no abdominal tenderness. There is no CVA tenderness, guarding or rebound.     Hernia: No hernia is present.  Musculoskeletal:     Cervical back: Normal range of motion and neck supple.  Lymphadenopathy:     Head:     Right side of head: Tonsillar adenopathy present. No submental, submandibular, preauricular, posterior auricular or occipital adenopathy.     Left side of head: Tonsillar adenopathy present. No submental, submandibular, preauricular, posterior auricular or occipital adenopathy.     Cervical: No cervical adenopathy.     Right cervical: No superficial, deep or posterior cervical adenopathy.    Left cervical: No superficial, deep or posterior cervical adenopathy.     Upper Body:     Right upper body: No supraclavicular or  epitrochlear adenopathy.     Left upper body: No supraclavicular or epitrochlear adenopathy.     Lower Body: Right inguinal adenopathy present. No left inguinal adenopathy.  Skin:    General: Skin is warm, dry and intact.     Findings: No rash.  Neurological:     Mental Status: He is alert.  Deep Tendon Reflexes: Reflexes are normal and symmetric.  Psychiatric:        Mood and Affect: Mood and affect normal.        Speech: Speech normal.        Behavior: Behavior normal.        Judgment: Judgment normal.       Results for orders placed or performed in visit on 10/29/22  POC COVID-19  Result Value Ref Range   SARS Coronavirus 2 Ag Negative Negative  POCT rapid strep A  Result Value Ref Range   Rapid Strep A Screen Positive (A) Negative    Assessment and Plan  Acute cough -     POCT Mono (Epstein Barr Virus) -     POC COVID-19 BinaxNow -     Mononucleosis screen -     CBC with Differential/Platelet  Acute sore throat -     POCT rapid strep A -     Mononucleosis screen  Lymphadenopathy -     Mononucleosis screen  Other orders -     guaiFENesin-Codeine; Take 5-10 mLs by mouth at bedtime as needed for cough.  Dispense: 180 mL; Refill: 0 -     Azithromycin; take 2 tablets today, then 1 tablet a day for 4 more days.  Dispense: 6 tablet; Refill: 0   Positive strep test, but possible carrier. COVID test negative. Will evaluate and rule out mono given severity and persistence of symptoms. Unclear why right lymph node sore and enlarged.  Will evaluate CBC with differential. Treat with azithromycin to cover for strep and possible bacterial bronchitis. Cough suppressant at night: Codeine with guaifenesin 5 mL p.o. nightly as needed cough. Tylenol as needed for symptoms. Some evidence of mild dehydration: Push water.  Return and ER precautions provided   No follow-ups on file.   Kerby Nora, MD

## 2022-10-29 NOTE — Patient Instructions (Signed)
Please stop at the lab to have labs drawn. Push fluids, rest.  Use cough suppressant at night. Tylenol for fever and sore throat.  Complete antibiotics.

## 2022-10-30 LAB — CBC WITH DIFFERENTIAL/PLATELET
Basophils Absolute: 0 10*3/uL (ref 0.0–0.2)
Basos: 1 %
EOS (ABSOLUTE): 0.1 10*3/uL (ref 0.0–0.4)
Eos: 2 %
Hematocrit: 46.6 % (ref 37.5–51.0)
Hemoglobin: 15.1 g/dL (ref 13.0–17.7)
Immature Grans (Abs): 0 10*3/uL (ref 0.0–0.1)
Immature Granulocytes: 0 %
Lymphocytes Absolute: 2 10*3/uL (ref 0.7–3.1)
Lymphs: 34 %
MCH: 29.5 pg (ref 26.6–33.0)
MCHC: 32.4 g/dL (ref 31.5–35.7)
MCV: 91 fL (ref 79–97)
Monocytes Absolute: 0.6 10*3/uL (ref 0.1–0.9)
Monocytes: 11 %
Neutrophils Absolute: 3 10*3/uL (ref 1.4–7.0)
Neutrophils: 52 %
Platelets: 302 10*3/uL (ref 150–450)
RBC: 5.12 x10E6/uL (ref 4.14–5.80)
RDW: 11.8 % (ref 11.6–15.4)
WBC: 5.8 10*3/uL (ref 3.4–10.8)

## 2022-10-30 LAB — MONONUCLEOSIS SCREEN: Mono Screen: NEGATIVE

## 2022-11-11 ENCOUNTER — Ambulatory Visit
Admission: EM | Admit: 2022-11-11 | Discharge: 2022-11-11 | Disposition: A | Discharge status: Home / Self Care | Payer: 59 | Attending: Emergency Medicine | Admitting: Emergency Medicine

## 2022-11-11 DIAGNOSIS — B349 Viral infection, unspecified: Secondary | ICD-10-CM | POA: Insufficient documentation

## 2022-11-11 DIAGNOSIS — Z1152 Encounter for screening for COVID-19: Secondary | ICD-10-CM | POA: Diagnosis not present

## 2022-11-11 LAB — POCT RAPID STREP A (OFFICE): Rapid Strep A Screen: NEGATIVE

## 2022-11-11 LAB — POCT INFLUENZA A/B
Influenza A, POC: NEGATIVE
Influenza B, POC: NEGATIVE

## 2022-11-11 NOTE — ED Provider Notes (Signed)
Robert Charles    CSN: [id number] Arrival date & time: 11/11/22  1917      History   Chief Complaint Chief Complaint  Patient presents with   Fever    HPI Robert Charles is a 37 y.o. male.  Patient presents with sore throat and fatigue since last night.  He has had a fever today.  Tmax 101.5.  He has been taking Robitussin with Tylenol.  He has a mild cough.  No rash, ear pain, shortness of breath, chest pain, vomiting, diarrhea, or other symptoms.  Patient was seen by his PCP on 10/29/2022; diagnosed with cough, sore throat, lymphadenopathy; positive for strep; treated with Zithromax and guaifenesin-codeine.  The history is provided by the patient and medical records.    Past Medical History:  Diagnosis Date   Bradycardia    Resting HR in 30s at times    Patient Active Problem List   Diagnosis Date Noted   Family history of coronary artery disease 08/13/2022   Mixed hyperlipidemia 08/13/2022   HNP (herniated nucleus pulposus), cervical 12/02/2018    Past Surgical History:  Procedure Laterality Date   ANKLE SURGERY Right reconstruction   2005   POSTERIOR CERVICAL LAMINECTOMY Left 12/02/2018   Procedure: Left Cervical Six-Cervical Seven  cervical laminotomy, foraminotomy, and microdiscectomy;  Surgeon: Shirlean Caydan Mctavish, MD;  Location: [facility] OR;  Service: Neurosurgery;  Laterality: Left;  Left Cervical Six-Cervical Seven  cervical laminotomy, foraminotomy, and microdiscectomy   wisdom tooth removal         Home Medications    Prior to Admission medications   Medication Sig Start Date End Date Taking? Authorizing Provider  guaiFENesin-codeine (ROBITUSSIN AC) 100-10 MG/5ML syrup Take 5-10 mLs by mouth at bedtime as needed for cough. 10/29/22   Bedsole, Amy E, MD  ibuprofen (ADVIL) 200 MG tablet Take 400 mg by mouth every 6 (six) hours as needed for headache or moderate pain.    [provider]  rosuvastatin (CRESTOR) 10 MG tablet Take 1 tablet (10 mg total) by  mouth daily. 08/13/22   CoplandKarleen Hampshire, MD    Family History Family History  Problem Relation Age of Onset   Heart disease Father     Social History Social History   Tobacco Use   Smoking status: Never    Passive exposure: Never   Smokeless tobacco: Never  Vaping Use   Vaping status: Never Used  Substance Use Topics   Alcohol use: Yes    Alcohol/week: 10.0 standard drinks of alcohol    Types: 10 Standard drinks or equivalent per week    Comment: moderate   Drug use: No     Allergies   Patient has no known allergies.   Review of Systems Review of Systems  Constitutional:  Positive for fatigue and fever. Negative for chills.  HENT:  Positive for sore throat. Negative for ear pain.   Respiratory:  Positive for cough. Negative for shortness of breath.   Cardiovascular:  Negative for palpitations.  Gastrointestinal:  Negative for diarrhea and vomiting.  Skin:  Negative for color change and rash.     Physical Exam Triage Vital Signs ED Triage Vitals  Encounter Vitals Group     BP      Systolic BP Percentile      Diastolic BP Percentile      Pulse      Resp      Temp      Temp src      SpO2  Weight      Height      Head Circumference      Peak Flow      Pain Score      Pain Loc      Pain Education      Exclude from Growth Chart    No data found.  Updated Vital Signs BP 124/80   Pulse 97   Temp 100.3 F (37.9 C)   Resp 18   SpO2 97%   Visual Acuity Right Eye Distance:   Left Eye Distance:   Bilateral Distance:    Right Eye Near:   Left Eye Near:    Bilateral Near:     Physical Exam Vitals and nursing note reviewed.  Constitutional:      General: He is not in acute distress.    Appearance: He is well-developed. He is ill-appearing.  HENT:     Right Ear: Tympanic membrane normal.     Left Ear: Tympanic membrane normal.     Nose: Nose normal.     Mouth/Throat:     Mouth: Mucous membranes are moist.     Pharynx: Posterior  oropharyngeal erythema present.  Cardiovascular:     Rate and Rhythm: Normal rate and regular rhythm.     Heart sounds: Normal heart sounds.  Pulmonary:     Effort: Pulmonary effort is normal. No respiratory distress.     Breath sounds: Normal breath sounds.  Musculoskeletal:     Cervical back: Neck supple.  Skin:    General: Skin is warm and dry.  Neurological:     Mental Status: He is alert.  Psychiatric:        Mood and Affect: Mood normal.        Behavior: Behavior normal.      UC Treatments / Results  Labs (all labs ordered are listed, but only abnormal results are displayed) Labs Reviewed  SARS CORONAVIRUS 2 (TAT 6-24 HRS)  POCT RAPID STREP A (OFFICE)  POCT INFLUENZA A/B    EKG   Radiology No results found.  Procedures Procedures (including critical care time)  Medications Ordered in UC Medications - No data to display  Initial Impression / Assessment and Plan / UC Course  I have reviewed the triage vital signs and the nursing notes.  Pertinent labs & imaging results that were available during my care of the patient were reviewed by me and considered in my medical decision making (see chart for details).   Viral illness.  Rapid strep negative.  Rapid flu negative.  COVID pending.  Discussed symptomatic treatment including Tylenol, rest, hydration.  Instructed patient to follow up with PCP if symptoms are not improving.  He agrees to plan of care.  Final Clinical Impressions(s) / UC Diagnoses   Final diagnoses:  Viral illness     Discharge Instructions      Your strep and flu tests are negative.  Your COVID test is pending.    Take Tylenol as needed for fever or discomfort.  Rest and keep yourself hydrated.    Follow-up with your primary care provider if your symptoms are not improving.         ED Prescriptions   None    PDMP not reviewed this encounter.   Mickie Bail, NP 11/11/22 1958

## 2022-11-11 NOTE — ED Triage Notes (Signed)
Patient to Urgent Care with complaints of fevers/ muscle aches/ fatigue/ sore throat.  Reports symptoms started last night but reports recurrent illness for the last month.  Has been taking robitussin max and cold flu/ acetaminophen. Max temp 101.5.

## 2022-11-11 NOTE — Discharge Instructions (Addendum)
Your strep and flu tests are negative.    Your COVID test is pending.    Take Tylenol as needed for fever or discomfort.  Rest and keep yourself hydrated.    Follow-up with your primary care provider if your symptoms are not improving.

## 2022-12-25 ENCOUNTER — Other Ambulatory Visit (HOSPITAL_COMMUNITY): Payer: Self-pay

## 2022-12-25 MED ORDER — COVID-19 MRNA VAC-TRIS(PFIZER) 30 MCG/0.3ML IM SUSY
0.3000 mL | PREFILLED_SYRINGE | Freq: Once | INTRAMUSCULAR | 0 refills | Status: AC
  Filled 2022-12-25: qty 0.3, 1d supply, fill #0

## 2022-12-25 MED ORDER — INFLUENZA VIRUS VACC SPLIT PF (FLUZONE) 0.5 ML IM SUSY
0.5000 mL | PREFILLED_SYRINGE | Freq: Once | INTRAMUSCULAR | 0 refills | Status: AC
  Filled 2022-12-25: qty 0.5, 1d supply, fill #0

## 2023-01-07 ENCOUNTER — Ambulatory Visit (HOSPITAL_COMMUNITY)
Admission: RE | Admit: 2023-01-07 | Discharge: 2023-01-07 | Disposition: A | Discharge status: Home / Self Care | Payer: 59 | Source: Ambulatory Visit | Attending: Vascular Surgery | Admitting: Vascular Surgery

## 2023-01-07 ENCOUNTER — Encounter: Payer: Self-pay | Admitting: Vascular Surgery

## 2023-01-07 ENCOUNTER — Ambulatory Visit: Payer: 59 | Admitting: Vascular Surgery

## 2023-01-07 VITALS — BP 121/84 | HR 68 | Temp 98.3°F | Resp 20 | Ht 73.75 in | Wt 184.0 lb

## 2023-01-07 DIAGNOSIS — I83812 Varicose veins of left lower extremities with pain: Secondary | ICD-10-CM | POA: Diagnosis not present

## 2023-01-07 DIAGNOSIS — I872 Venous insufficiency (chronic) (peripheral): Secondary | ICD-10-CM

## 2023-01-07 NOTE — Progress Notes (Signed)
Patient ID: Robert Charles, male   DOB: Jul 05, 1985, 37 y.o.   MRN: [id number]  Reason for Consult: Follow-up   Referred by Hannah Beat, MD  Subjective:     HPI:  Robert Charles is a 37 y.o. male with history of multiple varicosities of his left lower extremity.  These do cause him significant pain and discomfort including aching and heaviness which is aggravated by sitting and standing but he does have relief with elevation.  He works frequently on his feet the [employer] symptoms are worse at the end of the day.  He has been wearing thigh-high compression stockings since his initial evaluation.  No history of DVT or previous vein procedures.  He states that his left leg has been somewhat larger than his right since an injury in college which caused him to lose some muscle mass of the right lower extremity.  Past Medical History:  Diagnosis Date   Bradycardia    Resting HR in 30s at times   Family History  Problem Relation Age of Onset   Heart disease Father    Past Surgical History:  Procedure Laterality Date   ANKLE SURGERY Right reconstruction   2005   POSTERIOR CERVICAL LAMINECTOMY Left 12/02/2018   Procedure: Left Cervical Six-Cervical Seven  cervical laminotomy, foraminotomy, and microdiscectomy;  Surgeon: Shirlean Kelly, MD;  Location: [facility] OR;  Service: Neurosurgery;  Laterality: Left;  Left Cervical Six-Cervical Seven  cervical laminotomy, foraminotomy, and microdiscectomy   wisdom tooth removal      Short Social History:  Social History   Tobacco Use   Smoking status: Never    Passive exposure: Never   Smokeless tobacco: Never  Substance Use Topics   Alcohol use: Yes    Alcohol/week: 10.0 standard drinks of alcohol    Types: 10 Standard drinks or equivalent per week    Comment: moderate    No Known Allergies  Current Outpatient Medications  Medication Sig Dispense Refill   guaiFENesin-codeine (ROBITUSSIN AC) 100-10 MG/5ML syrup Take 5-10 mLs by  mouth at bedtime as needed for cough. 180 mL 0   ibuprofen (ADVIL) 200 MG tablet Take 400 mg by mouth every 6 (six) hours as needed for headache or moderate pain.     rosuvastatin (CRESTOR) 10 MG tablet Take 1 tablet (10 mg total) by mouth daily. 90 tablet 3   No current facility-administered medications for this visit.    Review of Systems  Constitutional:  Constitutional negative. HENT: HENT negative.  Eyes: Eyes negative.  Respiratory: Respiratory negative.  Cardiovascular: Positive for leg swelling.  GI: Gastrointestinal negative.  Musculoskeletal: Positive for leg pain.  Skin: Skin negative.  Neurological: Neurological negative. Hematologic: Hematologic/lymphatic negative.  Psychiatric: Psychiatric negative.        Objective:  Objective   Vitals:   01/07/23 1425  BP: 121/84  Pulse: 68  Resp: 20  Temp: 98.3 F (36.8 C)  SpO2: 98%  Weight: 184 lb (83.5 kg)  Height: 6' 1.75" (1.873 m)   Body mass index is 23.78 kg/m.  Physical Exam HENT:     Head: Normocephalic.     Nose: Nose normal.  Eyes:     Pupils: Pupils are equal, round, and reactive to light.  Cardiovascular:     Rate and Rhythm: Normal rate.     Pulses: Normal pulses.  Pulmonary:     Effort: Pulmonary effort is normal.  Abdominal:     General: Abdomen is flat.  Musculoskeletal:  Right lower leg: No edema.     Left lower leg: Edema present.  Skin:    General: Skin is warm.     Capillary Refill: Capillary refill takes less than 2 seconds.  Neurological:     General: No focal deficit present.     Mental Status: He is alert.  Psychiatric:        Mood and Affect: Mood normal.        Thought Content: Thought content normal.        Judgment: Judgment normal.     Data: LEFT          Reflux NoRefluxReflux TimeDiameter cmsComments                                 Yes                                           +--------------+---------+------+-----------+------------+---------------+  CFV                    yes   >1 second                              +--------------+---------+------+-----------+------------+---------------+  FV prox                 yes   >1 second                              +--------------+---------+------+-----------+------------+---------------+  FV mid                  yes   >1 second             duplicated vein  +--------------+---------+------+-----------+------------+---------------+  FV dist                 yes   >1 second             duplicated vein  +--------------+---------+------+-----------+------------+---------------+  Popliteal              yes   >1 second                              +--------------+---------+------+-----------+------------+---------------+  GSV at SFJ              yes    >500 ms      1.09                     +--------------+---------+------+-----------+------------+---------------+  GSV prox thigh          yes    >500 ms      0.91                     +--------------+---------+------+-----------+------------+---------------+  GSV mid thigh           yes    >500 ms      0.88                     +--------------+---------+------+-----------+------------+---------------+  GSV dist thigh          yes    >500 ms      0.84                     +--------------+---------+------+-----------+------------+---------------+  GSV at knee             yes    >500 ms      0.92                     +--------------+---------+------+-----------+------------+---------------+  GSV prox calf           yes    >500 ms      0.82    branching        +--------------+---------+------+-----------+------------+---------------+  SSV Pop Fossa no                            0.23                     +--------------+---------+------+-----------+------------+---------------+  SSV prox calf no                             0.27                     +--------------+---------+------+-----------+------------+---------------+  PFV                    yes  > 1 second                              +--------------+---------+------+-----------+------------+---------------+         Summary:  Left:  - No evidence of deep vein thrombosis seen in the left lower extremity,  from the common femoral through the popliteal veins.  - No evidence of superficial venous thrombosis in the left lower  extremity.    - No evidence of superficial venous reflux seen in the left short  saphenous vein.    - Venous reflux is noted in the left common femoral vein.  - Venous reflux is noted in the left sapheno-femoral junction.  - Venous reflux is noted in the left greater saphenous vein in the thigh.  - Venous reflux is noted in the left greater saphenous vein in the calf.  - Venous reflux is noted in the left femoral vein.  - Venous reflux is noted in the left popliteal vein.      Assessment/Plan:     37 year old male really appears to have C3 venous disease left lower extremity with multiple symptomatic very large varicosities.  We evaluated his great saphenous vein at bedside today which is incredibly large measuring over 1 cm on today's evaluation and was within the fascia from above the knee all the way to the saphenofemoral junction and there were multiple varicosities coming off of this just below the knee.  We have discussed the need for continued compression stockings.  We discussed the option of treatment which would include great saphenous vein ablation on the left side we also discussed the risk of DVT inherent with this procedure.  We also discussed that the treat the varicosities he will require stab phlebectomy greater than 20 and to treat the multiple telangiectasias at the level of his ankle he would require at least 2 vials of sclerotherapy.  He demonstrates good understanding would like  to proceed and will continue compression stockings and we will schedule him in the near future.     Maeola Harman MD [facility]

## 2023-01-08 ENCOUNTER — Ambulatory Visit (INDEPENDENT_AMBULATORY_CARE_PROVIDER_SITE_OTHER)
Admission: RE | Admit: 2023-01-08 | Discharge: 2023-01-08 | Disposition: A | Discharge status: Home / Self Care | Payer: 59 | Source: Ambulatory Visit | Attending: Family Medicine | Admitting: Family Medicine

## 2023-01-08 ENCOUNTER — Ambulatory Visit (INDEPENDENT_AMBULATORY_CARE_PROVIDER_SITE_OTHER): Payer: 59 | Admitting: Family Medicine

## 2023-01-08 ENCOUNTER — Other Ambulatory Visit (HOSPITAL_COMMUNITY): Payer: Self-pay

## 2023-01-08 VITALS — BP 94/70 | HR 66 | Temp 98.1°F | Ht 73.75 in | Wt 182.4 lb

## 2023-01-08 DIAGNOSIS — R052 Subacute cough: Secondary | ICD-10-CM

## 2023-01-08 DIAGNOSIS — R053 Chronic cough: Secondary | ICD-10-CM | POA: Diagnosis not present

## 2023-01-08 DIAGNOSIS — R058 Other specified cough: Secondary | ICD-10-CM | POA: Diagnosis not present

## 2023-01-08 DIAGNOSIS — R059 Cough, unspecified: Secondary | ICD-10-CM | POA: Diagnosis not present

## 2023-01-08 DIAGNOSIS — J208 Acute bronchitis due to other specified organisms: Secondary | ICD-10-CM

## 2023-01-08 MED ORDER — DOXYCYCLINE HYCLATE 100 MG PO TABS
100.0000 mg | ORAL_TABLET | Freq: Two times a day (BID) | ORAL | 0 refills | Status: AC
  Filled 2023-01-08: qty 20, 10d supply, fill #0

## 2023-01-08 NOTE — Progress Notes (Addendum)
Mirenda Baltazar T. Collin Hendley, MD, CAQ Sports Medicine [facility] at [facility] [address] [place], [address]  Phone: [phone]  FAX: [phone]  Robert Charles - 37 y.o. male  MRN [id number]  Date of Birth: 08/13/1985  Date: 01/08/2023  PCP: Hannah Beat, MD  Referral: Hannah Beat, MD  Chief Complaint  Patient presents with   Cough    Persistent x 4 months and now productive  Seen by Dr. Patsy Lager back in September   Subjective:   Robert Charles is a 37 y.o. very pleasant male patient with Body mass index is 23.57 kg/m. who presents with the following:  He has been having a prolonged cough over the last 4 months.  Right now, he is coughing every day quite a bit.  Has been seen a number of times, ended up seeing Dr. Ermalene Searing in September.  Had strep then.   Placed on a Zpak  Had been going on since mid-augutst.  Coughing so much can't lie flat.  A couple of weeks had 105 fever.  Got really short of breath.  Neg flu and covid PCR for covid are still negative in addition to rapid test  Had hand foot and mouth, which has since resolved  Currently no fever and no polyarthralgia or myalgia Cough never got better    Review of Systems is noted in the HPI, as appropriate  Objective:   BP 94/70 (BP Location: Right Arm, Patient Position: Sitting, Cuff Size: Large)   Pulse 66   Temp 98.1 F (36.7 C) (Temporal)   Ht 6' 1.75" (1.873 m)   Wt 182 lb 6 oz (82.7 kg)   SpO2 99%   BMI 23.57 kg/m    Gen: WDWN, NAD. Globally Non-toxic HEENT: Throat clear, w/o exudate, R TM clear, L TM - good landmarks, No fluid present. rhinnorhea.  MMM Frontal sinuses: NT Max sinuses: NT NECK: Anterior cervical  LAD is absent CV: RRR, No M/G/R, cap refill <2 sec PULM: Breathing comfortably in no respiratory distress. no wheezing, crackles, rhonchi   Laboratory and Imaging Data:  Assessment and Plan:     ICD-10-CM   1. Acute bronchitis due to other  specified organisms  J20.8     2. Subacute cough  R05.2 DG Chest 2 View    3. Productive cough  R05.8 DG Chest 2 View    4. Chronic cough  R05.3 CT Chest W Contrast     Prolonged cough for 4 months.  On my review the patient's x-ray, I think that there is some increased opacity in the right middle lobe, but radiology did not feel like this was a cardiopulmonary process.  Nevertheless with symptoms additionally and x-ray, I think that he needs to be treated for pneumonia.  If symptoms persist longer than 2 weeks, we can always do some additional testing, as well.  Addendum: 02/09/23 4:38 PM  At this point, the patient is contacted me again when he continues to have a persistent productive cough that has been present now for greater than 6 months.  I am going to get a chest CT with contrast to evaluate for potential chronic lung disease, pulmonary fibrosis, or malignancy.  He has failed multiple rounds of antibiotics and he continues to be struggling with his cough.  On plain x-ray, I was concerned about possible consolidation, but radiology felt as if it was normal.   Medication Management during today's office visit: Meds ordered this encounter  Medications  doxycycline (VIBRA-TABS) 100 MG tablet    Sig: Take 1 tablet (100 mg total) by mouth 2 (two) times daily for 10 days.    Dispense:  20 tablet    Refill:  0   There are no discontinued medications.  Orders placed today for conditions managed today: Orders Placed This Encounter  Procedures   DG Chest 2 View   CT Chest W Contrast    Disposition: No follow-ups on file.  Dr[employer] One speech-to-text software was used for transcription in this dictation.  Possible transcriptional errors can occur using Animal nutritionist.   Signed,  Elpidio Galea. Nohelani Benning, MD   Outpatient Encounter Medications as of 01/08/2023  Medication Sig   [EXPIRED] doxycycline (VIBRA-TABS) 100 MG tablet Take 1 tablet (100 mg total) by mouth 2 (two)  times daily for 10 days.   guaiFENesin-codeine (ROBITUSSIN AC) 100-10 MG/5ML syrup Take 5-10 mLs by mouth at bedtime as needed for cough.   ibuprofen (ADVIL) 200 MG tablet Take 400 mg by mouth every 6 (six) hours as needed for headache or moderate pain.   rosuvastatin (CRESTOR) 10 MG tablet Take 1 tablet (10 mg total) by mouth daily.   No facility-administered encounter medications on file as of 01/08/2023.

## 2023-01-29 ENCOUNTER — Other Ambulatory Visit: Payer: Self-pay | Admitting: *Deleted

## 2023-01-29 DIAGNOSIS — I83812 Varicose veins of left lower extremities with pain: Secondary | ICD-10-CM

## 2023-02-03 ENCOUNTER — Other Ambulatory Visit (HOSPITAL_COMMUNITY): Payer: Self-pay

## 2023-02-03 ENCOUNTER — Other Ambulatory Visit: Payer: Self-pay | Admitting: *Deleted

## 2023-02-03 MED ORDER — LORAZEPAM 1 MG PO TABS
1.0000 mg | ORAL_TABLET | Freq: Once | ORAL | 0 refills | Status: AC
Start: 2023-02-03 — End: 2023-02-05
  Filled 2023-02-03: qty 2, 2d supply, fill #0

## 2023-02-03 MED ORDER — LORAZEPAM 1 MG PO TABS: ORAL_TABLET | ORAL | 0 refills | Status: DC

## 2023-02-06 ENCOUNTER — Encounter: Payer: Self-pay | Admitting: Family Medicine

## 2023-02-09 NOTE — Addendum Note (Signed)
Addended by: Hannah Beat on: 02/09/2023 04:38 PM   Modules accepted: Orders

## 2023-02-10 ENCOUNTER — Inpatient Hospital Stay: Admission: RE | Admit: 2023-02-10 | Payer: 59 | Source: Ambulatory Visit

## 2023-02-10 NOTE — Telephone Encounter (Signed)
-----   Message from Roanna Epley sent at 02/10/2023  1:58 PM EST ----- Regarding: pt was no show for CT imaging Dr. Patsy Lager,  This message is to advise you that your patient did not show for their CT scan appointment this afternoon.  (Feinstein, Gavynn[birth year]) He was scheduled today @ [facility] @ 1340 for a CT chest with contrast. If he would like to reschedule, he may reach out to our scheduling team @ [phone].    Sincerely,  Avril L. Hines BSRT (R) (CT)  CT Technologist [facility]  [address] [place], [place] [address] P: [phone] ext [phone] F: [phone] [email or web address]

## 2023-02-11 ENCOUNTER — Ambulatory Visit
Admission: RE | Admit: 2023-02-11 | Discharge: 2023-02-11 | Disposition: A | Discharge status: Home / Self Care | Payer: 59 | Source: Ambulatory Visit | Attending: Family Medicine | Admitting: Family Medicine

## 2023-02-11 DIAGNOSIS — R053 Chronic cough: Secondary | ICD-10-CM

## 2023-02-11 MED ORDER — IOPAMIDOL (ISOVUE-300) INJECTION 61%
75.0000 mL | Freq: Once | INTRAVENOUS | Status: AC | PRN
  Administered 2023-02-11: 75 mL via INTRAVENOUS

## 2023-02-12 ENCOUNTER — Encounter: Payer: Self-pay | Admitting: Vascular Surgery

## 2023-02-12 ENCOUNTER — Ambulatory Visit: Payer: 59 | Admitting: Vascular Surgery

## 2023-02-12 VITALS — BP 133/86 | HR 86 | Temp 96.9°F | Resp 16 | Ht 74.0 in | Wt 180.0 lb

## 2023-02-12 DIAGNOSIS — I83812 Varicose veins of left lower extremities with pain: Secondary | ICD-10-CM

## 2023-02-12 HISTORY — PX: LASER ABLATION: SHX1947

## 2023-02-12 NOTE — Progress Notes (Signed)
      Patient name: Robert Charles MRN: [id number] DOB: 05-04-1985 Sex: male  REASON FOR VISIT: Treatment of left lower extremity venous insufficiency and symptomatic varicosities  HPI: Robert Charles is a 37 y.o. male history of multiple varicose veins of his left lower extremity with significant pain and discomfort.  He is on his feet multiple hours a day and [facility] and states his symptoms are worse.  He does have associated swelling of the left lower extremity consistent with C3 venous disease.  He has been compliant with thigh-high compression stockings.  Current Outpatient Medications  Medication Sig Dispense Refill   guaiFENesin-codeine (ROBITUSSIN AC) 100-10 MG/5ML syrup Take 5-10 mLs by mouth at bedtime as needed for cough. 180 mL 0   ibuprofen (ADVIL) 200 MG tablet Take 400 mg by mouth every 6 (six) hours as needed for headache or moderate pain.     LORazepam (ATIVAN) 1 MG tablet Take 1 tablet 30 to 60 minutes prior to leaving house on day of office surgery.  Bring second tablet with you to the office on day of office surgery. 2 tablet 0   rosuvastatin (CRESTOR) 10 MG tablet Take 1 tablet (10 mg total) by mouth daily. 90 tablet 3   No current facility-administered medications for this visit.    PHYSICAL EXAM: Vitals:   02/12/23 0846  BP: 133/86  Pulse: 86  Resp: 16  Temp: (!) 96.9 F (36.1 C)  SpO2: 97%   Awake alert and oriented Nonlabored respirations Varicosities of the left lower extremity marked with the patient standing  PROCEDURE: Left greater saphenous vein ablation totaling 28 cm and stab phlebectomy greater than 20 of the left medial leg and ankle and anterior lateral knee  TECHNIQUE: [name] was taken to the procedural room where he was initially evaluated standing and his varicosities were marked on the skin.  He was then laid supine on his left lower extremity was circumferentially prepped and draped in a sterile fashion and a timeout was called.   We began using ultrasound to identify the great saphenous vein at the level of the knee which is very large and the area was anesthetized 1% lidocaine and cannulated with a micropuncture needle followed by wire and a sheath.  A J-wire was placed several centimeters from the saphenofemoral junction as there were no branch points of the great saphenous vein proximally.  Laser catheter was placed followed by the laser to a total of 20 cm all under ultrasound guidance.  The laser path was then anesthetized with tumescent anesthesia and the vein was ablated entirely.  At completion the common femoral vein was patent and compressible by ultrasound.  Attention was then turned to the multiple varicosities of the medial leg and anterior lateral knee as well as medial and anterior ankle.  The areas were anesthetized with lidocaine and tumescent anesthesia was instilled and stab incisions were created with 11 blade.  Varicosities were grabbed with crochet hook and removed with hemostat.  At completion the greater than 20 Steri-Strips were placed and a sterile compressive dressing was applied.  He tolerated the procedure very well without immediate complication.  Plan will be to follow-up with rule out DVT duplex.  Robert Charles [facility] [phone]

## 2023-02-12 NOTE — Progress Notes (Signed)
     Laser Ablation Procedure    Date: 02/12/2023  Robert Charles DOB:1985-03-07  Consent signed: Yes     Surgeon:  Dr. Lemar Livings  Procedure: Laser Ablation: left Greater Saphenous Vein  BP 133/86 (BP Location: Left Arm, Patient Position: Sitting, Cuff Size: Normal)   Pulse 86   Temp (!) 96.9 F (36.1 C) (Temporal)   Resp 16   Ht 6\' 2"  (1.88 m)   Wt 180 lb (81.6 kg)   SpO2 97%   BMI 23.11 kg/m   Tumescent Anesthesia: 900 cc 0.9% NaCl with 50 cc Lidocaine HCL 1%  and 15 cc 8.4% NaHCO3 Local Anesthesia: 28 cc Lidocaine HCL and NaHCO3 (ratio 2:1) 7 watts continuous mode   Total energy: 1407.07 joules    Total time: 201 seconds Treatment Length 28 cm  Laser Fiber Ref. # [id number]       Lot # [id number]  Stab Phlebectomy: >20 Sites: Thigh, Calf, and Ankle  Patient tolerated procedure well  Notes: All staff members wore facial masks. Pt had 1   mg (1 tab) ativan at 07:15 am prior to the surgical procedure  Description of Procedure:  After marking the course of the secondary varicosities, the patient was placed on the operating table in the supine position, and the left leg was prepped and draped in sterile fashion.   Local anesthetic was administered and under ultrasound guidance the saphenous vein was accessed with a micro needle and guide wire; then the mirco puncture sheath was placed.  A guide wire was inserted saphenofemoral junction , followed by a 5 french sheath.  The position of the sheath and then the laser fiber below the junction was confirmed using the ultrasound.  Tumescent anesthesia was administered along the course of the saphenous vein using ultrasound guidance. The patient was placed in Trendelenburg position and protective laser glasses were placed on patient and staff, and the laser was fired at 7 watts continuous mode for a total of 1407.7 joules.  For stab phlebectomies, local anesthetic was administered at the previously marked varicosities, and tumescent  anesthesia was administered around the vessels.  Greater than 20 stab wounds were made using the tip of an 11 blade. And using the vein hook, the phlebectomies were performed using a hemostat to avulse the varicosities.  Adequate hemostasis was achieved.   Steri strips were applied to the stab wounds and ABD pads and thigh high compression stockings were applied.  Ace wrap bandages were applied over the phlebectomy sites and at the top of the saphenofemoral junction. Blood loss was less than 15 cc.  Discharge instructions reviewed with patient and hardcopy of discharge instructions given to patient to take home. The patient ambulated out of the operating room having tolerated the procedure well.

## 2023-02-13 ENCOUNTER — Encounter: Payer: Self-pay | Admitting: Family Medicine

## 2023-03-05 ENCOUNTER — Ambulatory Visit (HOSPITAL_COMMUNITY)
Admission: RE | Admit: 2023-03-05 | Discharge: 2023-03-05 | Disposition: A | Discharge status: Home / Self Care | Payer: 59 | Source: Ambulatory Visit | Attending: Vascular Surgery | Admitting: Vascular Surgery

## 2023-03-05 ENCOUNTER — Ambulatory Visit (INDEPENDENT_AMBULATORY_CARE_PROVIDER_SITE_OTHER): Payer: 59 | Admitting: Vascular Surgery

## 2023-03-05 VITALS — BP 130/85 | HR 68 | Temp 98.0°F | Wt 180.0 lb

## 2023-03-05 DIAGNOSIS — I83812 Varicose veins of left lower extremities with pain: Secondary | ICD-10-CM | POA: Diagnosis not present

## 2023-03-05 NOTE — Progress Notes (Signed)
     Subjective:     Patient ID: Robert Charles, male   DOB: 22-Feb-1986, 38 y.o.   MRN: [id number]  HPI 38 year old male status post left greater saphenous vein ablation and stab colectomy greater than 20 performed for C3 venous disease.  He has not had any pain and is healing nicely without complaints   Review of Systems No complaints    Objective:   Physical Exam Vitals:   03/05/23 1028  BP: 130/85  Pulse: 68  Temp: 98 F (36.7 C)  SpO2: 99%    After   Before    Assessment/plan     38 year old male status post the above procedure with satisfactory results and ultrasound demonstrating no evidence of DVT today.  He is okay to follow-up on an as-needed basis.       Alfonse Garringer C. Sheree, MD [facility] Office: [phone] Pager: [phone]

## 2023-03-26 ENCOUNTER — Ambulatory Visit: Payer: 59 | Admitting: Family Medicine

## 2023-03-26 ENCOUNTER — Other Ambulatory Visit (HOSPITAL_COMMUNITY): Payer: Self-pay

## 2023-03-26 VITALS — BP 108/80 | HR 82 | Temp 98.5°F | Ht 73.75 in | Wt 184.2 lb

## 2023-03-26 DIAGNOSIS — B999 Unspecified infectious disease: Secondary | ICD-10-CM | POA: Diagnosis not present

## 2023-03-26 DIAGNOSIS — R051 Acute cough: Secondary | ICD-10-CM | POA: Diagnosis not present

## 2023-03-26 LAB — POC INFLUENZA A&B (BINAX/QUICKVUE)
Influenza A, POC: NEGATIVE
Influenza B, POC: NEGATIVE

## 2023-03-26 LAB — POC COVID19 BINAXNOW: SARS Coronavirus 2 Ag: NEGATIVE

## 2023-03-26 MED ORDER — PREDNISONE 10 MG PO TABS
ORAL_TABLET | ORAL | 0 refills | Status: AC
  Filled 2023-03-26: qty 15, 7d supply, fill #0

## 2023-03-26 MED ORDER — AMOXICILLIN-POT CLAVULANATE 875-125 MG PO TABS
1.0000 | ORAL_TABLET | Freq: Two times a day (BID) | ORAL | 0 refills | Status: DC
  Filled 2023-03-26: qty 20, 10d supply, fill #0

## 2023-03-26 NOTE — Progress Notes (Signed)
Patient ID: Robert Charles, male    DOB: Sep 23, 1985, 38 y.o.   MRN: [id number]  This visit was conducted in person.  BP 108/80 (BP Location: Right Arm, Patient Position: Sitting, Cuff Size: Large)   Pulse 82   Temp 98.5 F (36.9 C) (Temporal)   Ht 6' 1.75" (1.873 m)   Wt 184 lb 4 oz (83.6 kg)   SpO2 99%   BMI 23.82 kg/m    CC:  Chief Complaint  Patient presents with   Cough    Started on Monday-Negative Covid Test yesterday   Nasal Congestion   Fever   Ear Fullness   Facial Pressure    Subjective:   HPI: Robert Charles is a 38 y.o. male  pt of Dr. Paulette Blanch on 03/26/2023 for Cough (Started on Monday-Negative Covid Test yesterday), Nasal Congestion, Fever, Ear Fullness, and Facial Pressure   Kids started daycare in last 6 months. Has been sick almost constantly with different things in  this time period.  Had chronic cough... but got back to baseline with zyrtec, flonase and  prilosec.  Date of onset: 4 days Initial symptoms included  nasal pressure, nasal congestion,  Symptoms progressed to cough, fever 99-101 F  Pain in face    Sick contacts:   none COVID testing:   none     He has tried to treat with  Afrin,  robitussin, acetaminophen, flonase, zyrtec     No history of chronic lung disease such as asthma or COPD. Non-smoker.   LAst antibiotics in 12/2022     Relevant past medical, surgical, family and social history reviewed and updated as indicated. Interim medical history since our last visit reviewed. Allergies and medications reviewed and updated. Outpatient Medications Prior to Visit  Medication Sig Dispense Refill   cetirizine (ZYRTEC) 10 MG chewable tablet Chew 10 mg by mouth daily.     fluticasone (FLONASE) 50 MCG/ACT nasal spray Place 2 sprays into both nostrils daily.     ibuprofen (ADVIL) 200 MG tablet Take 400 mg by mouth every 6 (six) hours as needed for headache or moderate pain.     oxymetazoline (AFRIN ORIGINAL) 0.05 % nasal spray  Place 1 spray into both nostrils 2 (two) times daily.     rosuvastatin (CRESTOR) 10 MG tablet Take 1 tablet (10 mg total) by mouth daily. 90 tablet 3   guaiFENesin-codeine (ROBITUSSIN AC) 100-10 MG/5ML syrup Take 5-10 mLs by mouth at bedtime as needed for cough. (Patient not taking: Reported on 03/26/2023) 180 mL 0   No facility-administered medications prior to visit.     Per HPI unless specifically indicated in ROS section below Review of Systems  Constitutional:  Negative for fatigue and fever.  HENT:  Positive for sinus pressure. Negative for ear pain.   Eyes:  Negative for pain.  Respiratory:  Positive for cough. Negative for shortness of breath.   Cardiovascular:  Negative for chest pain, palpitations and leg swelling.  Gastrointestinal:  Negative for abdominal pain.  Genitourinary:  Negative for dysuria.  Musculoskeletal:  Negative for arthralgias.  Neurological:  Negative for syncope, light-headedness and headaches.  Psychiatric/Behavioral:  Negative for dysphoric mood.    Objective:  BP 108/80 (BP Location: Right Arm, Patient Position: Sitting, Cuff Size: Large)   Pulse 82   Temp 98.5 F (36.9 C) (Temporal)   Ht 6' 1.75" (1.873 m)   Wt 184 lb 4 oz (83.6 kg)   SpO2 99%   BMI 23.82 kg/m   Wt  Readings from Last 3 Encounters:  03/26/23 184 lb 4 oz (83.6 kg)  03/05/23 180 lb (81.6 kg)  02/12/23 180 lb (81.6 kg)      Physical Exam Constitutional:      Appearance: He is well-developed.  HENT:     Head: Normocephalic.     Right Ear: Hearing normal. A middle ear effusion is present. Tympanic membrane is erythematous.     Left Ear: Hearing normal. A middle ear effusion is present.     Nose: Nose normal.  Neck:     Thyroid: No thyroid mass or thyromegaly.     Vascular: No carotid bruit.     Trachea: Trachea normal.  Cardiovascular:     Rate and Rhythm: Normal rate and regular rhythm.     Pulses: Normal pulses.     Heart sounds: Heart sounds not distant. No murmur  heard.    No friction rub. No gallop.     Comments: No peripheral edema Pulmonary:     Effort: Pulmonary effort is normal. No respiratory distress.     Breath sounds: Normal breath sounds.  Skin:    General: Skin is warm and dry.     Findings: No rash.  Psychiatric:        Speech: Speech normal.        Behavior: Behavior normal.        Thought Content: Thought content normal.       Results for orders placed or performed in visit on 03/26/23  POC COVID-19   Collection Time: 03/26/23 10:25 AM  Result Value Ref Range   SARS Coronavirus 2 Ag Negative Negative  POC Influenza A&B (Binax test)   Collection Time: 03/26/23 10:25 AM  Result Value Ref Range   Influenza A, POC Negative Negative   Influenza B, POC Negative Negative    Assessment and Plan  Acute cough Assessment & Plan: Acute, symptoms most consistent with viral upper respiratory tract infection but right ear with changes concerning for possible early otitis media. Patient also has had recurrent infections frequently in the last 6 months. Will treat with prednisone taper to decrease swelling in sinuses and allow eustachian tubes to drain and Augmentin to cover for bacterial infection. He can continue using Mucinex, Zyrtec.  He will stop Afrin and once prednisone is completed start back on Flonase 2 sprays per nostril daily.  Return and ER precautions provided  Orders: -     POC COVID-19 BinaxNow -     POC Influenza A&B(BINAX/QUICKVUE)  Recurrent infections Assessment & Plan: Patient reports almost constant infections in the last 6 months.  Possibly only due to kids starting in daycare.  CBC was normal in September.  Encouraged handwashing, regular multivitamin as well as vitamin C and zinc.  If recurrent infections continue consider reevaluation with CBC when feeling well.  He will discuss with PCP as well.   Other orders -     Amoxicillin-Pot Clavulanate; Take 1 tablet by mouth 2 (two) times daily.  Dispense: 20  tablet; Refill: 0 -     predniSONE; Take 3 tablets (30 mg total) by mouth daily for 3 days, THEN 2 tablets (20 mg total) daily for 2 days, THEN 1 tablet (10 mg total) daily for 2 days.  Dispense: 15 tablet; Refill: 0    No follow-ups on file.   Kerby Nora, MD

## 2023-03-26 NOTE — Assessment & Plan Note (Signed)
Patient reports almost constant infections in the last 6 months.  Possibly only due to kids starting in daycare.  CBC was normal in September.  Encouraged handwashing, regular multivitamin as well as vitamin C and zinc.  If recurrent infections continue consider reevaluation with CBC when feeling well.  He will discuss with PCP as well.

## 2023-03-26 NOTE — Assessment & Plan Note (Signed)
Acute, symptoms most consistent with viral upper respiratory tract infection but right ear with changes concerning for possible early otitis media. Patient also has had recurrent infections frequently in the last 6 months. Will treat with prednisone taper to decrease swelling in sinuses and allow eustachian tubes to drain and Augmentin to cover for bacterial infection. He can continue using Mucinex, Zyrtec.  He will stop Afrin and once prednisone is completed start back on Flonase 2 sprays per nostril daily.  Return and ER precautions provided

## 2023-09-16 ENCOUNTER — Telehealth: Payer: Self-pay | Admitting: Family Medicine

## 2023-09-16 DIAGNOSIS — R5383 Other fatigue: Secondary | ICD-10-CM

## 2023-09-16 DIAGNOSIS — Z131 Encounter for screening for diabetes mellitus: Secondary | ICD-10-CM

## 2023-09-16 DIAGNOSIS — Z13220 Encounter for screening for lipoid disorders: Secondary | ICD-10-CM

## 2023-09-16 NOTE — Telephone Encounter (Signed)
 Patient scheduled for CPE for 8/11 would like lab orders placed so he may go to [facility] advised he should have done by 8/4 patient verbalized understanding.

## 2023-09-16 NOTE — Addendum Note (Signed)
 Addended by: WENDELL ARLAND RAMAN on: 09/16/2023 04:11 PM   Modules accepted: Orders

## 2023-09-16 NOTE — Telephone Encounter (Addendum)
 CPE Labs ordered in Epic for [facility].  Beverley notified by telephone that his lab orders have been placed as requested.

## 2023-09-29 DIAGNOSIS — Z131 Encounter for screening for diabetes mellitus: Secondary | ICD-10-CM | POA: Diagnosis not present

## 2023-09-29 DIAGNOSIS — R5383 Other fatigue: Secondary | ICD-10-CM | POA: Diagnosis not present

## 2023-09-29 DIAGNOSIS — Z13220 Encounter for screening for lipoid disorders: Secondary | ICD-10-CM | POA: Diagnosis not present

## 2023-09-30 LAB — LIPID PANEL

## 2023-10-02 ENCOUNTER — Ambulatory Visit: Payer: Self-pay | Admitting: Family Medicine

## 2023-10-02 LAB — LIPID PANEL
Cholesterol, Total: 150 mg/dL (ref 100–199)
HDL: 57 mg/dL (ref >39)
LDL CALC COMMENT:: 2.6 ratio (ref 0.0–5.0)
LDL Chol Calc (NIH): 78 mg/dL (ref 0–99)
Triglycerides: 77 mg/dL (ref 0–149)
VLDL Cholesterol Cal: 15 mg/dL (ref 5–40)

## 2023-10-02 LAB — CBC WITH DIFFERENTIAL/PLATELET
Basophils Absolute: 0 x10E3/uL (ref 0.0–0.2)
Basos: 1 %
EOS (ABSOLUTE): 0 x10E3/uL (ref 0.0–0.4)
Eos: 1 %
Hematocrit: 46.5 % (ref 37.5–51.0)
Hemoglobin: 14.9 g/dL (ref 13.0–17.7)
Immature Grans (Abs): 0 x10E3/uL (ref 0.0–0.1)
Immature Granulocytes: 0 %
Lymphocytes Absolute: 2.4 x10E3/uL (ref 0.7–3.1)
Lymphs: 46 %
MCH: 29.6 pg (ref 26.6–33.0)
MCHC: 32 g/dL (ref 31.5–35.7)
MCV: 92 fL (ref 79–97)
Monocytes Absolute: 0.4 x10E3/uL (ref 0.1–0.9)
Monocytes: 8 %
Neutrophils Absolute: 2.3 x10E3/uL (ref 1.4–7.0)
Neutrophils: 44 %
Platelets: 262 x10E3/uL (ref 150–450)
RBC: 5.04 x10E6/uL (ref 4.14–5.80)
RDW: 12.7 % (ref 11.6–15.4)
WBC: 5.1 x10E3/uL (ref 3.4–10.8)

## 2023-10-02 LAB — HEPATIC FUNCTION PANEL
ALT: 13 IU/L (ref 0–44)
AST: 20 IU/L (ref 0–40)
Albumin: 4.2 g/dL (ref 4.1–5.1)
Alkaline Phosphatase: 71 IU/L (ref 44–121)
Bilirubin Total: 0.6 mg/dL (ref 0.0–1.2)
Bilirubin, Direct: 0.2 mg/dL (ref 0.00–0.40)
Total Protein: 6.5 g/dL (ref 6.0–8.5)

## 2023-10-02 LAB — BASIC METABOLIC PANEL WITH GFR
BUN/Creatinine Ratio: 18 (ref 9–20)
BUN: 22 mg/dL — AB (ref 6–20)
CO2: 19 mmol/L — AB (ref 20–29)
Calcium: 9.6 mg/dL (ref 8.7–10.2)
Chloride: 102 mmol/L (ref 96–106)
Creatinine, Ser: 1.24 mg/dL (ref 0.76–1.27)
Glucose: 101 mg/dL — AB (ref 70–99)
Potassium: 4.6 mmol/L (ref 3.5–5.2)
Sodium: 139 mmol/L (ref 134–144)
eGFR: 76 mL/min/1.73 (ref >59)

## 2023-10-02 LAB — HEMOGLOBIN A1C
Est. average glucose Bld gHb Est-mCnc: 111 mg/dL
Hgb A1c MFr Bld: 5.5 % (ref 4.8–5.6)

## 2023-10-03 ENCOUNTER — Encounter: Payer: Self-pay | Admitting: Family Medicine

## 2023-10-03 NOTE — Progress Notes (Signed)
 Davi Rotan T. Caedan Sumler, MD, CAQ Sports Medicine [facility] at [facility] [address] [place], [address]  Phone: [phone]  FAX: [phone]  Robert Charles - 38 y.o. male  MRN [id number]  Date of Birth: [birth year]  Date: 10/05/2023  PCP: Watt Mirza, MD  Referral: Watt Mirza, MD  No chief complaint on file.  Patient Care Team: Watt Mirza, MD as PCP - General (Family Medicine) Subjective:   Robert Charles is a 38 y.o. pleasant patient who presents with the following:  Preventative Health Maintenance Visit:  Health Maintenance Summary Reviewed and updated, unless pt declines services.  Tobacco History Reviewed. Alcohol: No concerns, no excessive use Exercise Habits: Some activity, rec at least 30 mins 5 times a week STD concerns: no risk or activity to increase risk Drug Use: None  [name] is here for general health maintenance.    Health Maintenance  Topic Date Due   Hepatitis B Vaccines (3 of 3 - 3-dose series) 08/17/1997   HPV VACCINES (1 - 3-dose SCDM series) Never done   COVID-19 Vaccine (5 - Mixed Product risk 2024-25 season) 06/24/2023   INFLUENZA VACCINE  05/24/2024 (Originally 09/25/2023)   DTaP/Tdap/Td (9 - Td or Tdap) 08/08/2031   Hepatitis C Screening  Completed   HIV Screening  Completed   Meningococcal B Vaccine  Aged Out   Immunization History  Administered Date(s) Administered   DTaP 07/29/1985, 09/28/1985, 11/29/1985, 12/01/1986, 09/28/1990   HIB (PRP-OMP) 12/01/1986   Hepatitis A, Ped/Adol-2 Dose 06/05/2005   Hepatitis B, PED/ADOLESCENT 12/08/1996, 12/12/1996, 06/22/1997   IPV 07/29/1985, 09/28/1985, 11/29/1985, 12/01/1986, 09/28/1990   Influenza Split 12/31/2011   Influenza, Seasonal, Injecte, Preservative Fre 12/25/2022   Influenza-Unspecified 11/24/2018, 11/25/2019, 12/02/2021   MMR 08/31/1986, 09/28/1990   Meningococcal Conjugate 06/05/2005   Moderna Sars-Covid-2 Vaccination 02/14/2020    PFIZER(Purple Top)SARS-COV-2 Vaccination 02/16/2019, 03/07/2019   Pfizer(Comirnaty )Fall Seasonal Vaccine 12 years and older 12/25/2022   Td 09/11/2003   Tdap 08/30/2003, 08/07/2021   Typhoid Live 06/05/2005   Yellow Fever 06/05/2005   Patient Active Problem List   Diagnosis Date Noted   Mixed hyperlipidemia 08/13/2022    Priority: Medium    Family history of coronary artery disease 08/13/2022   HNP (herniated nucleus pulposus), cervical 12/02/2018    Past Medical History:  Diagnosis Date   Bradycardia    Resting HR in 30s at times   Mixed hyperlipidemia 08/13/2022    Past Surgical History:  Procedure Laterality Date   ANKLE SURGERY Right reconstruction   2005   LASER ABLATION Left 02/12/2023   Left greater saphenous vein laser ablation with >20 stab phlebectomies   POSTERIOR CERVICAL LAMINECTOMY Left 12/02/2018   Procedure: Left Cervical Six-Cervical Seven  cervical laminotomy, foraminotomy, and microdiscectomy;  Surgeon: Alix Charleston, MD;  Location: [facility] OR;  Service: Neurosurgery;  Laterality: Left;  Left Cervical Six-Cervical Seven  cervical laminotomy, foraminotomy, and microdiscectomy   wisdom tooth removal      Family History  Problem Relation Age of Onset   Heart disease Father     Social History   Social History Narrative   Loss adjuster, chartered, [employer]   Married with 1 child   Enjoys golf and plays in a baseball league.    Past Medical History, Surgical History, Social History, Family History, Problem List, Medications, and Allergies have been reviewed and updated if relevant.  Review of Systems: Pertinent positives are listed above.  Otherwise, a full 14 point review of systems has been  done in full and it is negative except where it is noted positive.  Objective:   There were no vitals taken for this visit. Ideal Body Weight:    Ideal Body Weight:   No results found.    08/13/2022   11:09 AM 08/07/2021   10:26 AM 11/15/2014    3:51 PM   Depression screen PHQ 2/9  Decreased Interest 0 0 0  Down, Depressed, Hopeless 0 0 0  PHQ - 2 Score 0 0 0     GEN: well developed, well nourished, no acute distress Eyes: conjunctiva and lids normal, PERRLA, EOMI ENT: TM clear, nares clear, oral exam WNL Neck: supple, no lymphadenopathy, no thyromegaly, no JVD Pulm: clear to auscultation and percussion, respiratory effort normal CV: regular rate and rhythm, S1-S2, no murmur, rub or gallop, no bruits, peripheral pulses normal and symmetric, no cyanosis, clubbing, edema or varicosities GI: soft, non-tender; no hepatosplenomegaly, masses; active bowel sounds all quadrants GU: deferred Lymph: no cervical, axillary or inguinal adenopathy MSK: gait normal, muscle tone and strength WNL, no joint swelling, effusions, discoloration, crepitus  SKIN: clear, good turgor, color WNL, no rashes, lesions, or ulcerations Neuro: normal mental status, normal strength, sensation, and motion Psych: alert; oriented to person, place and time, normally interactive and not anxious or depressed in appearance.  All labs reviewed with patient. Results for orders placed or performed in visit on 09/16/23  Lipid panel   Collection Time: 09/29/23 10:46 AM  Result Value Ref Range   Cholesterol, Total 150 100 - 199 mg/dL   Triglycerides 77 0 - 149 mg/dL   HDL 57 >60 mg/dL   VLDL Cholesterol Cal 15 5 - 40 mg/dL   LDL Chol Calc (NIH) 78 0 - 99 mg/dL   Chol/HDL Ratio 2.6 0.0 - 5.0 ratio  CBC with Differential/Platelet   Collection Time: 09/29/23 10:46 AM  Result Value Ref Range   WBC 5.1 3.4 - 10.8 x10E3/uL   RBC 5.04 4.14 - 5.80 x10E6/uL   Hemoglobin 14.9 13.0 - 17.7 g/dL   Hematocrit 53.4 62.4 - 51.0 %   MCV 92 79 - 97 fL   MCH 29.6 26.6 - 33.0 pg   MCHC 32.0 31.5 - 35.7 g/dL   RDW 87.2 88.3 - 84.5 %   Platelets 262 150 - 450 x10E3/uL   Neutrophils 44 Not Estab. %   Lymphs 46 Not Estab. %   Monocytes 8 Not Estab. %   Eos 1 Not Estab. %   Basos 1 Not  Estab. %   Neutrophils Absolute 2.3 1.4 - 7.0 x10E3/uL   Lymphocytes Absolute 2.4 0.7 - 3.1 x10E3/uL   Monocytes Absolute 0.4 0.1 - 0.9 x10E3/uL   EOS (ABSOLUTE) 0.0 0.0 - 0.4 x10E3/uL   Basophils Absolute 0.0 0.0 - 0.2 x10E3/uL   Immature Granulocytes 0 Not Estab. %   Immature Grans (Abs) 0.0 0.0 - 0.1 x10E3/uL  Hepatic function panel   Collection Time: 09/29/23 10:46 AM  Result Value Ref Range   Total Protein 6.5 6.0 - 8.5 g/dL   Albumin 4.2 4.1 - 5.1 g/dL   Bilirubin Total 0.6 0.0 - 1.2 mg/dL   Bilirubin, Direct 9.79 0.00 - 0.40 mg/dL   Alkaline Phosphatase 71 44 - 121 IU/L   AST 20 0 - 40 IU/L   ALT 13 0 - 44 IU/L  Hemoglobin A1c   Collection Time: 09/29/23 10:46 AM  Result Value Ref Range   Hgb A1c MFr Bld 5.5 4.8 - 5.6 %  Est. average glucose Bld gHb Est-mCnc 111 mg/dL  Basic metabolic panel   Collection Time: 09/29/23 10:46 AM  Result Value Ref Range   Glucose 101 (H) 70 - 99 mg/dL   BUN 22 (H) 6 - 20 mg/dL   Creatinine, Ser 8.75 0.76 - 1.27 mg/dL   eGFR 76 >40 fO/fpw/8.26   BUN/Creatinine Ratio 18 9 - 20   Sodium 139 134 - 144 mmol/L   Potassium 4.6 3.5 - 5.2 mmol/L   Chloride 102 96 - 106 mmol/L   CO2 19 (L) 20 - 29 mmol/L   Calcium  9.6 8.7 - 10.2 mg/dL    Assessment and Plan:     ICD-10-CM   1. Healthcare maintenance  Z00.00       Health Maintenance Exam: The patient's preventative maintenance and recommended screening tests for an annual wellness exam were reviewed in full today. Brought up to date unless services declined.  Counselled on the importance of diet, exercise, and its role in overall health and mortality. The patient's FH and SH was reviewed, including their home life, tobacco status, and drug and alcohol status.  Follow-up in 1 year for physical exam or additional follow-up below.  Disposition: No follow-ups on file.  No orders of the defined types were placed in this encounter.  There are no discontinued medications. No orders of the  defined types were placed in this encounter.   Signed,  Jacques DASEN. Onnie Alatorre, MD   Allergies as of 10/05/2023   No Known Allergies      Medication List        Accurate as of October 03, 2023  9:09 AM. If you have any questions, ask your nurse or doctor.          Afrin Original 0.05 % nasal spray Generic drug: oxymetazoline Place 1 spray into both nostrils 2 (two) times daily.   amoxicillin -clavulanate 875-125 MG tablet Commonly known as: AUGMENTIN  Take 1 tablet by mouth 2 (two) times daily.   cetirizine 10 MG chewable tablet Commonly known as: ZYRTEC Chew 10 mg by mouth daily.   fluticasone 50 MCG/ACT nasal spray Commonly known as: FLONASE Place 2 sprays into both nostrils daily.   guaiFENesin -codeine  100-10 MG/5ML syrup Take 5-10 mLs by mouth at bedtime as needed for cough.   ibuprofen  200 MG tablet Commonly known as: ADVIL  Take 400 mg by mouth every 6 (six) hours as needed for headache or moderate pain.   rosuvastatin  10 MG tablet Commonly known as: Crestor  Take 1 tablet (10 mg total) by mouth daily.

## 2023-10-05 ENCOUNTER — Ambulatory Visit (INDEPENDENT_AMBULATORY_CARE_PROVIDER_SITE_OTHER): Admitting: Family Medicine

## 2023-10-05 VITALS — BP 104/72 | HR 69 | Temp 98.4°F | Ht 73.5 in | Wt 189.0 lb

## 2023-10-05 DIAGNOSIS — Z0000 Encounter for general adult medical examination without abnormal findings: Secondary | ICD-10-CM

## 2023-11-20 ENCOUNTER — Encounter (HOSPITAL_COMMUNITY): Payer: Self-pay | Admitting: *Deleted

## 2023-11-20 ENCOUNTER — Emergency Department (HOSPITAL_COMMUNITY)
Admission: EM | Admit: 2023-11-20 | Discharge: 2023-11-20 | Discharge status: Against Medical Advice | Source: Ambulatory Visit | Attending: Emergency Medicine | Admitting: Emergency Medicine

## 2023-11-20 ENCOUNTER — Encounter (HOSPITAL_COMMUNITY): Payer: Self-pay

## 2023-11-20 ENCOUNTER — Other Ambulatory Visit: Payer: Self-pay

## 2023-11-20 ENCOUNTER — Ambulatory Visit (HOSPITAL_COMMUNITY): Admission: EM | Admit: 2023-11-20 | Discharge: 2023-11-20 | Disposition: A | Discharge status: Home / Self Care

## 2023-11-20 DIAGNOSIS — R000 Tachycardia, unspecified: Secondary | ICD-10-CM

## 2023-11-20 DIAGNOSIS — M791 Myalgia, unspecified site: Secondary | ICD-10-CM | POA: Diagnosis not present

## 2023-11-20 DIAGNOSIS — R1110 Vomiting, unspecified: Secondary | ICD-10-CM | POA: Diagnosis not present

## 2023-11-20 DIAGNOSIS — R109 Unspecified abdominal pain: Secondary | ICD-10-CM | POA: Diagnosis not present

## 2023-11-20 DIAGNOSIS — Z5321 Procedure and treatment not carried out due to patient leaving prior to being seen by health care provider: Secondary | ICD-10-CM | POA: Diagnosis not present

## 2023-11-20 DIAGNOSIS — E860 Dehydration: Secondary | ICD-10-CM | POA: Diagnosis not present

## 2023-11-20 DIAGNOSIS — R197 Diarrhea, unspecified: Secondary | ICD-10-CM

## 2023-11-20 DIAGNOSIS — R1084 Generalized abdominal pain: Secondary | ICD-10-CM | POA: Diagnosis not present

## 2023-11-20 LAB — I-STAT CHEM 8, ED
BUN: 21 mg/dL — ABNORMAL HIGH (ref 6–20)
Calcium, Ion: 1.14 mmol/L — ABNORMAL LOW (ref 1.15–1.40)
Chloride: 105 mmol/L (ref 98–111)
Creatinine, Ser: 1.3 mg/dL — ABNORMAL HIGH (ref 0.61–1.24)
Glucose, Bld: 126 mg/dL — ABNORMAL HIGH (ref 70–99)
HCT: 50 % (ref 39.0–52.0)
Hemoglobin: 17 g/dL (ref 13.0–17.0)
Potassium: 4.3 mmol/L (ref 3.5–5.1)
Sodium: 139 mmol/L (ref 135–145)
TCO2: 23 mmol/L (ref 22–32)

## 2023-11-20 LAB — CBC WITH DIFFERENTIAL/PLATELET
Abs Immature Granulocytes: 0.02 K/uL (ref 0.00–0.07)
Basophils Absolute: 0 K/uL (ref 0.0–0.1)
Basophils Relative: 0 %
Eosinophils Absolute: 0 K/uL (ref 0.0–0.5)
Eosinophils Relative: 0 %
HCT: 49.2 % (ref 39.0–52.0)
Hemoglobin: 16.8 g/dL (ref 13.0–17.0)
Immature Granulocytes: 0 %
Lymphocytes Relative: 6 %
Lymphs Abs: 0.7 K/uL (ref 0.7–4.0)
MCH: 29.9 pg (ref 26.0–34.0)
MCHC: 34.1 g/dL (ref 30.0–36.0)
MCV: 87.5 fL (ref 80.0–100.0)
Monocytes Absolute: 0.5 K/uL (ref 0.1–1.0)
Monocytes Relative: 4 %
Neutro Abs: 11.1 K/uL — ABNORMAL HIGH (ref 1.7–7.7)
Neutrophils Relative %: 90 %
Platelets: 265 K/uL (ref 150–400)
RBC: 5.62 MIL/uL (ref 4.22–5.81)
RDW: 12 % (ref 11.5–15.5)
WBC: 12.4 K/uL — ABNORMAL HIGH (ref 4.0–10.5)
nRBC: 0 % (ref 0.0–0.2)

## 2023-11-20 LAB — COMPREHENSIVE METABOLIC PANEL WITH GFR
ALT: 32 U/L (ref 0–44)
AST: 32 U/L (ref 15–41)
Albumin: 4.6 g/dL (ref 3.5–5.0)
Alkaline Phosphatase: 45 U/L (ref 38–126)
Anion gap: 15 (ref 5–15)
BUN: 17 mg/dL (ref 6–20)
CO2: 20 mmol/L — ABNORMAL LOW (ref 22–32)
Calcium: 9.9 mg/dL (ref 8.9–10.3)
Chloride: 102 mmol/L (ref 98–111)
Creatinine, Ser: 1.22 mg/dL (ref 0.61–1.24)
GFR, Estimated: >60 mL/min (ref >60)
Glucose, Bld: 121 mg/dL — ABNORMAL HIGH (ref 70–99)
Potassium: 4.3 mmol/L (ref 3.5–5.1)
Sodium: 137 mmol/L (ref 135–145)
Total Bilirubin: 1 mg/dL (ref 0.0–1.2)
Total Protein: 7.8 g/dL (ref 6.5–8.1)

## 2023-11-20 LAB — POC OCCULT BLOOD, ED: Fecal Occult Bld: POSITIVE — AB

## 2023-11-20 LAB — LIPASE, BLOOD: Lipase: 20 U/L (ref 11–51)

## 2023-11-20 MED ORDER — ONDANSETRON 4 MG PO TBDP
8.0000 mg | ORAL_TABLET | Freq: Once | ORAL | Status: AC
  Administered 2023-11-20: 8 mg via ORAL
  Filled 2023-11-20: qty 2

## 2023-11-20 NOTE — ED Provider Triage Note (Signed)
 Emergency Medicine Provider Triage Evaluation Note  Robert Charles , a 38 y.o. male  was evaluated in triage.  Pt complains of diarrhea, vomiting and abdominal pain since 3am today. Patient reports 30 episodes of diarrhea and multiple episodes of emesis, some of which he described as coffee ground appearance near the end. He also reports abdominal cramping. Of note he was at a work event with a fixed menu and none of his coworkers are sick. He reports body aches, and feels feverish. No hx abdominal surgeries.  Review of Systems  Positive: Nausea, vomiting, diarrhea, body aches Negative:   Physical Exam  BP (!) 130/109   Pulse (!) 123   Temp 99.5 F (37.5 C)   Resp 18   Ht 6' 1 (1.854 m)   Wt 80.3 kg   SpO2 100%   BMI 23.36 kg/m  Gen:   Awake, no distress   Resp:  Normal effort  MSK:   Moves extremities without difficulty  Other:  Abdominal tenderness. Non-distended. Negative murphy, negative rovsing.  Medical Decision Making  Medically screening exam initiated at 6:14 PM.  Appropriate orders placed.  Robert Charles was informed that the remainder of the evaluation will be completed by another provider, this initial triage assessment does not replace that evaluation, and the importance of remaining in the ED until their evaluation is complete.    Torrence Marry RAMAN, [place] 11/20/23 1818

## 2023-11-20 NOTE — Discharge Instructions (Signed)
 Due to your current symptoms I suspect you have an electrolyte imbalance and you need to have labs and the proper fluids according to those labs, so it is best that you go to the ER right now.

## 2023-11-20 NOTE — ED Notes (Signed)
 Pt left d/t wait time. States if its critical they'll call me

## 2023-11-20 NOTE — ED Provider Notes (Addendum)
 [facility]    CSN: [id number] Arrival date & time: 11/20/23  1546      History   Chief Complaint Chief Complaint  Patient presents with   Emesis    HPI Robert Charles is a 38 y.o. male who presents with waking up with N/V/D at 3 am. He ate with coworkers last  night at [employer] where they have fixed menu, and none of them are sick. He has probably had 30 watery stools and 10 + episodes of vomiting since this started. While here was able to keep down about  16 of fluids. He feels weak and is getting cramps on his thighs. His pulse is normally 60 and from barely walking is high and feels easily fatigued. His RUQ is sore from vomiting so much.  He has been getting waves of cramps before the diarrhea hits.  He does not have any pt contact and has not been on any antibiotics in the past month.     Past Medical History:  Diagnosis Date   Bradycardia    Resting HR in 30s at times   Mixed hyperlipidemia 08/13/2022    Patient Active Problem List   Diagnosis Date Noted   Family history of coronary artery disease 08/13/2022   Mixed hyperlipidemia 08/13/2022   HNP (herniated nucleus pulposus), cervical 12/02/2018    Past Surgical History:  Procedure Laterality Date   ANKLE SURGERY Right reconstruction   2005   LASER ABLATION Left 02/12/2023   Left greater saphenous vein laser ablation with >20 stab phlebectomies   POSTERIOR CERVICAL LAMINECTOMY Left 12/02/2018   Procedure: Left Cervical Six-Cervical Seven  cervical laminotomy, foraminotomy, and microdiscectomy;  Surgeon: Alix Charleston, MD;  Location: [facility] OR;  Service: Neurosurgery;  Laterality: Left;  Left Cervical Six-Cervical Seven  cervical laminotomy, foraminotomy, and microdiscectomy   wisdom tooth removal         Home Medications    Prior to Admission medications   Medication Sig Start Date End Date Taking? Authorizing Provider  rosuvastatin  (CRESTOR ) 10 MG tablet Take 1 tablet (10 mg total) by mouth  daily. 08/13/22  Yes Copland, Jacques, MD    Family History Family History  Problem Relation Age of Onset   Heart disease Father     Social History Social History   Tobacco Use   Smoking status: Never    Passive exposure: Never   Smokeless tobacco: Never  Vaping Use   Vaping status: Never Used  Substance Use Topics   Alcohol use: Yes    Alcohol/week: 10.0 standard drinks of alcohol    Types: 10 Standard drinks or equivalent per week    Comment: moderate   Drug use: No     Allergies   Patient has no known allergies.   Review of Systems Review of Systems As noted in HPI  Physical Exam Triage Vital Signs ED Triage Vitals  Encounter Vitals Group     BP 11/20/23 1617 116/83     Girls Systolic BP Percentile --      Girls Diastolic BP Percentile --      Boys Systolic BP Percentile --      Boys Diastolic BP Percentile --      Pulse Rate 11/20/23 1617 (!) 125     Resp 11/20/23 1617 20     Temp 11/20/23 1617 99.4 F (37.4 C)     Temp Source 11/20/23 1617 Oral     SpO2 11/20/23 1617 96 %     Weight 11/20/23  1617 177 lb (80.3 kg)     Height 11/20/23 1617 6' 1.5 (1.867 m)     Head Circumference --      Peak Flow --      Pain Score 11/20/23 1616 2     Pain Loc --      Pain Education --      Exclude from Growth Chart --    No data found.  Updated Vital Signs BP 116/83 (BP Location: Right Arm)   Pulse (!) 125   Temp 99.4 F (37.4 C) (Oral)   Resp 20   Ht 6' 1.5 (1.867 m)   Wt 177 lb (80.3 kg)   SpO2 96%   BMI 23.04 kg/m   Visual Acuity Right Eye Distance:   Left Eye Distance:   Bilateral Distance:    Right Eye Near:   Left Eye Near:    Bilateral Near:     Physical Exam Vitals and nursing note reviewed.  Constitutional:      Appearance: He is ill-appearing.  Eyes:     General: No scleral icterus.    Conjunctiva/sclera: Conjunctivae normal.  Pulmonary:     Effort: Pulmonary effort is normal.  Abdominal:     General: Abdomen is flat.      Palpations: Abdomen is soft. There is no mass.     Tenderness: There is no abdominal tenderness.  Musculoskeletal:        General: Normal range of motion.     Cervical back: Neck supple.  Skin:    General: Skin is warm and dry.  Neurological:     Mental Status: He is alert and oriented to person, place, and time.     Gait: Gait normal.  Psychiatric:        Mood and Affect: Mood normal.        Behavior: Behavior normal.        Thought Content: Thought content normal.        Judgment: Judgment normal.      UC Treatments / Results  Labs (all labs ordered are listed, but only abnormal results are displayed) Labs Reviewed - No data to display   EKG   Radiology No results found.  Procedures Procedures (including critical care time)  Medications Ordered in UC Medications - No data to display  Initial Impression / Assessment and Plan / UC Course  I have reviewed the triage vital signs and the nursing notes.  Has gastroenteritis with dehydration and I suspect electrolyte imbalance. He was explained he needs labs done and once they have there results they can give him the proper fluids. He understands and went to ER right now.     Final Clinical Impressions(s) / UC Diagnoses   Final diagnoses:  Diarrhea, unspecified type  Dehydration     Discharge Instructions      Due to your current symptoms I suspect you have an electrolyte imbalance and you need to have labs and the proper fluids according to those labs, so it is best that you go to the ER right now.      ED Prescriptions   None    PDMP not reviewed this encounter.   Lindi Carter, PA-C 11/20/23 1700    Rodriguez-Southworth, [place], PA-C 11/20/23 1701

## 2023-11-20 NOTE — ED Notes (Signed)
 Patient is being discharged from the Urgent Care and sent to the Emergency Department via personal opperated vehicle . Per Rodriguez-Southworth, Kyra, PA-C, patient is in need of higher level of care due to Emesis and diarrhea. Patient is aware and verbalizes understanding of plan of care.  Vitals:   11/20/23 1617  BP: 116/83  Pulse: (!) 125  Resp: 20  Temp: 99.4 F (37.4 C)  SpO2: 96%

## 2023-11-20 NOTE — ED Triage Notes (Signed)
 The pt has been vomiting and diarrhea since last pm  some abd pain    he was [facility]  eh is c/o weakness and muscle cramps

## 2023-11-20 NOTE — ED Triage Notes (Signed)
 Onset around 0300 today with NVD. No one else with similar symptoms or known sick exposure. Patient ate a new restaurant with co workers last night but no one else is sick. States it was a fixed menu.   Also having chills but no known fever.
